# Patient Record
Sex: Male | Born: 1970 | Race: White | Hispanic: No | Marital: Single | Smoking: Current every day smoker
Health system: Southern US, Community
[De-identification: ages and names within clinical notes are randomized; demographics above are authoritative.]

## PROBLEM LIST (undated history)

## (undated) DIAGNOSIS — F1910 Other psychoactive substance abuse, uncomplicated: Secondary | ICD-10-CM

## (undated) DIAGNOSIS — F32A Depression, unspecified: Secondary | ICD-10-CM

## (undated) DIAGNOSIS — F419 Anxiety disorder, unspecified: Secondary | ICD-10-CM

## (undated) DIAGNOSIS — S069XAA Unspecified intracranial injury with loss of consciousness status unknown, initial encounter: Secondary | ICD-10-CM

## (undated) DIAGNOSIS — F329 Major depressive disorder, single episode, unspecified: Secondary | ICD-10-CM

## (undated) DIAGNOSIS — K759 Inflammatory liver disease, unspecified: Secondary | ICD-10-CM

## (undated) DIAGNOSIS — S069X9A Unspecified intracranial injury with loss of consciousness of unspecified duration, initial encounter: Secondary | ICD-10-CM

## (undated) DIAGNOSIS — L409 Psoriasis, unspecified: Secondary | ICD-10-CM

## (undated) HISTORY — PX: GASTROSTOMY TUBE PLACEMENT: SHX655

## (undated) HISTORY — PX: APPENDECTOMY: SHX54

---

## 2011-01-02 ENCOUNTER — Emergency Department (HOSPITAL_COMMUNITY)
Admission: EM | Admit: 2011-01-02 | Discharge: 2011-01-03 | Disposition: A | Discharge status: Other Acute Inpatient Hospital | Payer: Medicare Other | Source: Home / Self Care | Attending: Emergency Medicine | Admitting: Emergency Medicine

## 2011-01-02 DIAGNOSIS — F3289 Other specified depressive episodes: Secondary | ICD-10-CM | POA: Insufficient documentation

## 2011-01-02 DIAGNOSIS — F329 Major depressive disorder, single episode, unspecified: Secondary | ICD-10-CM | POA: Insufficient documentation

## 2011-01-02 DIAGNOSIS — I10 Essential (primary) hypertension: Secondary | ICD-10-CM | POA: Insufficient documentation

## 2011-01-02 DIAGNOSIS — F1910 Other psychoactive substance abuse, uncomplicated: Secondary | ICD-10-CM | POA: Insufficient documentation

## 2011-01-03 ENCOUNTER — Inpatient Hospital Stay (HOSPITAL_COMMUNITY)
Admission: RE | Admit: 2011-01-03 | Discharge: 2011-01-06 | DRG: 897 | Disposition: A | Discharge status: Home / Self Care | Payer: Medicare Other | Source: Ambulatory Visit | Attending: Psychiatry | Admitting: Psychiatry

## 2011-01-03 DIAGNOSIS — Z560 Unemployment, unspecified: Secondary | ICD-10-CM

## 2011-01-03 DIAGNOSIS — F1020 Alcohol dependence, uncomplicated: Secondary | ICD-10-CM

## 2011-01-03 DIAGNOSIS — F19939 Other psychoactive substance use, unspecified with withdrawal, unspecified: Secondary | ICD-10-CM

## 2011-01-03 DIAGNOSIS — Z111 Encounter for screening for respiratory tuberculosis: Secondary | ICD-10-CM

## 2011-01-03 DIAGNOSIS — B1920 Unspecified viral hepatitis C without hepatic coma: Secondary | ICD-10-CM

## 2011-01-03 DIAGNOSIS — F10239 Alcohol dependence with withdrawal, unspecified: Secondary | ICD-10-CM

## 2011-01-03 DIAGNOSIS — F10939 Alcohol use, unspecified with withdrawal, unspecified: Secondary | ICD-10-CM

## 2011-01-03 DIAGNOSIS — I10 Essential (primary) hypertension: Secondary | ICD-10-CM

## 2011-01-03 DIAGNOSIS — Z5900 Homelessness unspecified: Secondary | ICD-10-CM

## 2011-01-03 DIAGNOSIS — Z6379 Other stressful life events affecting family and household: Secondary | ICD-10-CM

## 2011-01-03 DIAGNOSIS — F1920 Other psychoactive substance dependence, uncomplicated: Principal | ICD-10-CM

## 2011-01-03 LAB — COMPREHENSIVE METABOLIC PANEL
Alkaline Phosphatase: 69 U/L (ref 39–117)
BUN: 12 mg/dL (ref 6–23)
CO2: 22 mEq/L (ref 19–32)
Calcium: 9.1 mg/dL (ref 8.4–10.5)
GFR calc non Af Amer: >60 mL/min (ref >60)
Glucose, Bld: 122 mg/dL — ABNORMAL HIGH (ref 70–99)
Potassium: 3.6 mEq/L (ref 3.5–5.1)
Total Protein: 6.3 g/dL (ref 6.0–8.3)

## 2011-01-03 LAB — CBC
HCT: 41.3 % (ref 39.0–52.0)
Hemoglobin: 14.6 g/dL (ref 13.0–17.0)
MCHC: 35.4 g/dL (ref 30.0–36.0)
MCV: 95.6 fL (ref 78.0–100.0)
RDW: 13.8 % (ref 11.5–15.5)

## 2011-01-03 LAB — DIFFERENTIAL
Eosinophils Relative: 4 % (ref 0–5)
Lymphocytes Relative: 37 % (ref 12–46)
Lymphs Abs: 2.9 10*3/uL (ref 0.7–4.0)
Monocytes Absolute: 0.6 10*3/uL (ref 0.1–1.0)

## 2011-01-03 LAB — ETHANOL: Alcohol, Ethyl (B): <11 mg/dL — ABNORMAL HIGH (ref 0–10)

## 2011-01-03 LAB — RAPID URINE DRUG SCREEN, HOSP PERFORMED
Barbiturates: NOT DETECTED
Opiates: NOT DETECTED

## 2011-01-04 DIAGNOSIS — F1920 Other psychoactive substance dependence, uncomplicated: Secondary | ICD-10-CM
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
## 2011-01-07 ENCOUNTER — Emergency Department (HOSPITAL_COMMUNITY)
Admission: EM | Admit: 2011-01-07 | Discharge: 2011-01-07 | Disposition: A | Discharge status: Home / Self Care | Payer: Medicare Other | Attending: Emergency Medicine | Admitting: Emergency Medicine

## 2011-01-07 ENCOUNTER — Emergency Department (HOSPITAL_COMMUNITY): Payer: Medicare Other

## 2011-01-07 DIAGNOSIS — R10813 Right lower quadrant abdominal tenderness: Secondary | ICD-10-CM | POA: Insufficient documentation

## 2011-01-07 DIAGNOSIS — F3289 Other specified depressive episodes: Secondary | ICD-10-CM | POA: Insufficient documentation

## 2011-01-07 DIAGNOSIS — F329 Major depressive disorder, single episode, unspecified: Secondary | ICD-10-CM | POA: Insufficient documentation

## 2011-01-07 DIAGNOSIS — R1031 Right lower quadrant pain: Secondary | ICD-10-CM | POA: Insufficient documentation

## 2011-01-07 DIAGNOSIS — I10 Essential (primary) hypertension: Secondary | ICD-10-CM | POA: Insufficient documentation

## 2011-01-07 LAB — CBC
MCV: 97.1 fL (ref 78.0–100.0)
Platelets: 292 10*3/uL (ref 150–400)
RBC: 4.85 MIL/uL (ref 4.22–5.81)
RDW: 13.7 % (ref 11.5–15.5)
WBC: 10.8 10*3/uL — ABNORMAL HIGH (ref 4.0–10.5)

## 2011-01-07 LAB — DIFFERENTIAL
Basophils Absolute: 0.1 10*3/uL (ref 0.0–0.1)
Basophils Relative: 1 % (ref 0–1)
Eosinophils Absolute: 0.4 10*3/uL (ref 0.0–0.7)
Eosinophils Relative: 3 % (ref 0–5)
Lymphs Abs: 3.4 10*3/uL (ref 0.7–4.0)
Neutrophils Relative %: 57 % (ref 43–77)

## 2011-01-07 LAB — URINALYSIS, ROUTINE W REFLEX MICROSCOPIC
Glucose, UA: NEGATIVE mg/dL
Ketones, ur: NEGATIVE mg/dL
Nitrite: NEGATIVE
Protein, ur: NEGATIVE mg/dL
Urobilinogen, UA: 0.2 mg/dL (ref 0.0–1.0)

## 2011-01-07 LAB — POCT I-STAT, CHEM 8
BUN: 14 mg/dL (ref 6–23)
Calcium, Ion: 1.15 mmol/L (ref 1.12–1.32)
HCT: 50 % (ref 39.0–52.0)
Sodium: 140 mEq/L (ref 135–145)
TCO2: 27 mmol/L (ref 0–100)

## 2011-01-07 LAB — ETHANOL: Alcohol, Ethyl (B): 45 mg/dL — ABNORMAL HIGH (ref 0–10)

## 2011-01-07 MED ORDER — IOHEXOL 300 MG/ML  SOLN: 100.0000 mL | Freq: Once | INTRAMUSCULAR | Status: DC | PRN

## 2011-01-08 ENCOUNTER — Emergency Department (HOSPITAL_COMMUNITY)
Admission: EM | Admit: 2011-01-08 | Discharge: 2011-01-10 | Disposition: A | Discharge status: Home / Self Care | Payer: Medicare Other | Attending: Emergency Medicine | Admitting: Emergency Medicine

## 2011-01-08 DIAGNOSIS — I10 Essential (primary) hypertension: Secondary | ICD-10-CM | POA: Insufficient documentation

## 2011-01-08 DIAGNOSIS — F329 Major depressive disorder, single episode, unspecified: Secondary | ICD-10-CM | POA: Insufficient documentation

## 2011-01-08 DIAGNOSIS — F1010 Alcohol abuse, uncomplicated: Secondary | ICD-10-CM | POA: Insufficient documentation

## 2011-01-08 DIAGNOSIS — F1110 Opioid abuse, uncomplicated: Secondary | ICD-10-CM | POA: Insufficient documentation

## 2011-01-08 DIAGNOSIS — F3289 Other specified depressive episodes: Secondary | ICD-10-CM | POA: Insufficient documentation

## 2011-01-08 LAB — URINALYSIS, ROUTINE W REFLEX MICROSCOPIC
Hgb urine dipstick: NEGATIVE
Nitrite: NEGATIVE
Protein, ur: NEGATIVE mg/dL
Specific Gravity, Urine: 1.024 (ref 1.005–1.030)
Urobilinogen, UA: 1 mg/dL (ref 0.0–1.0)

## 2011-01-08 LAB — RAPID URINE DRUG SCREEN, HOSP PERFORMED
Amphetamines: NOT DETECTED
Barbiturates: NOT DETECTED
Opiates: NOT DETECTED
Tetrahydrocannabinol: POSITIVE — AB

## 2011-01-09 LAB — COMPREHENSIVE METABOLIC PANEL
ALT: 112 U/L — ABNORMAL HIGH (ref 0–53)
Alkaline Phosphatase: 66 U/L (ref 39–117)
BUN: 12 mg/dL (ref 6–23)
CO2: 30 mEq/L (ref 19–32)
GFR calc non Af Amer: >60 mL/min (ref >60)
Glucose, Bld: 110 mg/dL — ABNORMAL HIGH (ref 70–99)
Potassium: 4 mEq/L (ref 3.5–5.1)
Sodium: 142 mEq/L (ref 135–145)
Total Bilirubin: 0.3 mg/dL (ref 0.3–1.2)

## 2011-01-09 LAB — DIFFERENTIAL
Basophils Absolute: 0 10*3/uL (ref 0.0–0.1)
Lymphocytes Relative: 37 % (ref 12–46)
Lymphs Abs: 2.9 10*3/uL (ref 0.7–4.0)
Monocytes Absolute: 0.8 10*3/uL (ref 0.1–1.0)
Neutro Abs: 3.7 10*3/uL (ref 1.7–7.7)

## 2011-01-09 LAB — CBC
HCT: 44.3 % (ref 39.0–52.0)
Hemoglobin: 15.4 g/dL (ref 13.0–17.0)
MCV: 97.1 fL (ref 78.0–100.0)
WBC: 7.7 10*3/uL (ref 4.0–10.5)

## 2011-01-09 LAB — ETHANOL: Alcohol, Ethyl (B): <11 mg/dL — ABNORMAL HIGH (ref 0–10)

## 2011-01-13 NOTE — Discharge Summary (Signed)
  NAMEMAYJOR, AGER                   ACCOUNT NO.:  [id number]  MEDICAL RECORD NO.:  [id number]           PATIENT TYPE:  I  LOCATION:  0300                          FACILITY:  [facility]  PHYSICIAN:  Anselm Jungling, MD  DATE OF BIRTH:  July 24, 1971  DATE OF ADMISSION:  01/03/2011 DATE OF DISCHARGE:  01/06/2011                              DISCHARGE SUMMARY   IDENTIFYING DATA/REASON FOR ADMISSION:  This was an inpatient psychiatric admission for Nael, a 40 year old male, who was admitted for treatment of chronic polysubstance dependence.  Please refer to the admission note for further details pertaining to the symptoms, circumstances and history that led to his hospitalization.  He was given an initial Axis I diagnosis of polysubstance dependence.  MEDICAL/LABORATORY:  The patient was medically and physically assessed by the psychiatric nurse practitioner.  He was in generally good health. He had mild elevations of liver function indices consistent with alcohol dependence.  Urine drug screen was positive for benzodiazepines and marijuana.  He participated in therapeutic groups and activities, and was a good participant throughout. He was detoxified using a standard Librium taper.  He worked closely with case management towards an aftercare plan, but ultimately it was determined that he would not enter any particular follow up program outside of [facility].  This was in keeping with his wishes.  He agreed to the following aftercare plan.  AFTERCARE:  The patient was to attend as many Narcotics Anonymous meetings in the community as he could.  DISCHARGE MEDICATIONS:  None.  DISCHARGE DIAGNOSES:  AXIS I:  Polysubstance dependence. AXIS II:  Deferred. AXIS III:  No acute or chronic illnesses. AXIS IV:  Stressors severe. AXIS V:  Global assessment of functioning on discharge 44.     Anselm Jungling, MD     SPB/MEDQ  D:  01/08/2011  T:  01/09/2011  Job:   [id number]  Electronically Signed by Geralyn Flash MD on 01/13/2011 02:00:57 PM

## 2011-01-20 ENCOUNTER — Emergency Department (HOSPITAL_COMMUNITY)
Admission: EM | Admit: 2011-01-20 | Discharge: 2011-01-21 | Disposition: A | Discharge status: Other Acute Inpatient Hospital | Payer: Medicare Other | Attending: Emergency Medicine | Admitting: Emergency Medicine

## 2011-01-20 DIAGNOSIS — F1110 Opioid abuse, uncomplicated: Secondary | ICD-10-CM | POA: Insufficient documentation

## 2011-01-21 DIAGNOSIS — F4310 Post-traumatic stress disorder, unspecified: Secondary | ICD-10-CM

## 2011-01-21 LAB — RAPID URINE DRUG SCREEN, HOSP PERFORMED
Amphetamines: NOT DETECTED
Barbiturates: NOT DETECTED
Benzodiazepines: POSITIVE — AB
Opiates: NOT DETECTED

## 2011-01-21 LAB — COMPREHENSIVE METABOLIC PANEL
Albumin: 3.6 g/dL (ref 3.5–5.2)
Alkaline Phosphatase: 68 U/L (ref 39–117)
BUN: 15 mg/dL (ref 6–23)
GFR calc Af Amer: >60 mL/min (ref >60)
Potassium: 3.8 mEq/L (ref 3.5–5.1)
Sodium: 138 mEq/L (ref 135–145)
Total Protein: 6.9 g/dL (ref 6.0–8.3)

## 2011-01-21 LAB — DIFFERENTIAL
Basophils Absolute: 0.1 10*3/uL (ref 0.0–0.1)
Basophils Relative: 1 % (ref 0–1)
Eosinophils Absolute: 0.3 10*3/uL (ref 0.0–0.7)
Eosinophils Relative: 5 % (ref 0–5)

## 2011-01-21 LAB — CBC
MCV: 98.2 fL (ref 78.0–100.0)
Platelets: 255 10*3/uL (ref 150–400)
RDW: 13.4 % (ref 11.5–15.5)
WBC: 6.9 10*3/uL (ref 4.0–10.5)

## 2011-01-21 NOTE — Consult Note (Signed)
  NAMEBURNEY, [name]                   ACCOUNT NO.:  [id number]  MEDICAL RECORD NO.:  [id number]           PATIENT TYPE:  E  LOCATION:  WLED                         FACILITY:  [facility]  PHYSICIAN:  Eulogio Ditch, MD DATE OF BIRTH:  08/16/1971  DATE OF CONSULTATION:  01/21/2011 DATE OF DISCHARGE:                                CONSULTATION   REASON FOR CONSULTATION:  Opioids abuse.  HISTORY OF PRESENT ILLNESS:  This is a 40 year old male reported using Dilaudid-K4, morphine, heroin and Xanax.  The patient reported that he had a surgery for appendix and was given pain medication.  After that, he started abusing these drugs.  The patient was recently discharged after detox on Jan 06, 2011, from behavioral health.  The patient reported that he wants to go to the inpatient rehab after the detox. The patient denies any suicidal or homicidal ideations, is very logical and goal directed, not internally preoccupied.  Denies hearing any voices.  PAST MEDICAL HISTORY:  History of hypertension, not on any medications.  SOCIAL HISTORY:  The patient is a homeless.  MENTAL STATUS EXAMINATION:  Calm, cooperative during the interview. Fair eye contact.  Hygiene and grooming fair.  No abnormal movements noticed.  Speech normal in rate, rhythm and volume.  Mood, depressed. Affect mood, congruent.  Thought process, logical and goal directed. Not suicidal or homicidal.  Not delusional.  No audiovisual hallucination reported.  Not internally preoccupied.  Cognition, alert, awake, oriented x3.  Memory, immediate, recent and remote fair. Attention concentration, fair.  Abstraction ability, fair.  Insight and judgment, poor because of the drug abuse.  DIAGNOSES:  Axis I:  Polysubstance dependence. Axis II:  Deferred. Axis III:  Hypertension. Axis IV:  Substance abuse. Axis V:  50.  RECOMMENDATIONS: 1. The patient will be started on clonidine detox protocol. 2. The patient will be referred to the  inpatient rehab.    Eulogio Ditch, MD    SA/MEDQ  D:  01/21/2011  T:  01/21/2011  Job:  [id number]  Electronically Signed by Eulogio Ditch  on 01/21/2011 06:46:21 PM

## 2012-03-23 ENCOUNTER — Emergency Department (HOSPITAL_COMMUNITY)
Admission: EM | Admit: 2012-03-23 | Discharge: 2012-03-23 | Disposition: A | Discharge status: Home / Self Care | Payer: Medicare Other

## 2012-03-23 NOTE — ED Notes (Signed)
Pt checked in with nurse first, then immediately stated he needed to go get his wallet and went outside.  Tech at doorway to await pt arrival back to ED.
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
## 2015-06-02 ENCOUNTER — Encounter (HOSPITAL_COMMUNITY): Payer: Self-pay | Admitting: Emergency Medicine

## 2015-06-02 ENCOUNTER — Emergency Department (HOSPITAL_COMMUNITY)
Admission: EM | Admit: 2015-06-02 | Discharge: 2015-06-02 | Disposition: A | Discharge status: Home / Self Care | Payer: Medicare HMO | Attending: Emergency Medicine | Admitting: Emergency Medicine

## 2015-06-02 ENCOUNTER — Emergency Department (HOSPITAL_COMMUNITY): Payer: Medicare HMO

## 2015-06-02 DIAGNOSIS — Y9389 Activity, other specified: Secondary | ICD-10-CM | POA: Diagnosis not present

## 2015-06-02 DIAGNOSIS — T43014A Poisoning by tricyclic antidepressants, undetermined, initial encounter: Secondary | ICD-10-CM | POA: Insufficient documentation

## 2015-06-02 DIAGNOSIS — Y9289 Other specified places as the place of occurrence of the external cause: Secondary | ICD-10-CM | POA: Insufficient documentation

## 2015-06-02 DIAGNOSIS — T50904A Poisoning by unspecified drugs, medicaments and biological substances, undetermined, initial encounter: Secondary | ICD-10-CM

## 2015-06-02 DIAGNOSIS — F1410 Cocaine abuse, uncomplicated: Secondary | ICD-10-CM | POA: Insufficient documentation

## 2015-06-02 DIAGNOSIS — Y998 Other external cause status: Secondary | ICD-10-CM | POA: Diagnosis not present

## 2015-06-02 DIAGNOSIS — Z87820 Personal history of traumatic brain injury: Secondary | ICD-10-CM | POA: Insufficient documentation

## 2015-06-02 HISTORY — DX: Unspecified intracranial injury with loss of consciousness of unspecified duration, initial encounter: S06.9X9A

## 2015-06-02 HISTORY — DX: Major depressive disorder, single episode, unspecified: F32.9

## 2015-06-02 HISTORY — DX: Unspecified intracranial injury with loss of consciousness status unknown, initial encounter: S06.9XAA

## 2015-06-02 HISTORY — DX: Anxiety disorder, unspecified: F41.9

## 2015-06-02 HISTORY — DX: Depression, unspecified: F32.A

## 2015-06-02 LAB — CBC WITH DIFFERENTIAL/PLATELET
BASOS ABS: 0 10*3/uL (ref 0.0–0.1)
BASOS PCT: 0 %
EOS ABS: 0.2 10*3/uL (ref 0.0–0.7)
Eosinophils Relative: 2 %
HEMATOCRIT: 42.6 % (ref 39.0–52.0)
Hemoglobin: 15 g/dL (ref 13.0–17.0)
Lymphocytes Relative: 29 %
Lymphs Abs: 2.5 10*3/uL (ref 0.7–4.0)
MCH: 32.8 pg (ref 26.0–34.0)
MCHC: 35.2 g/dL (ref 30.0–36.0)
MCV: 93 fL (ref 78.0–100.0)
MONO ABS: 0.5 10*3/uL (ref 0.1–1.0)
Monocytes Relative: 6 %
NEUTROS ABS: 5.5 10*3/uL (ref 1.7–7.7)
Neutrophils Relative %: 63 %
PLATELETS: 204 10*3/uL (ref 150–400)
RBC: 4.58 MIL/uL (ref 4.22–5.81)
RDW: 13.2 % (ref 11.5–15.5)
WBC: 8.7 10*3/uL (ref 4.0–10.5)

## 2015-06-02 LAB — URINALYSIS, ROUTINE W REFLEX MICROSCOPIC
Bilirubin Urine: NEGATIVE
GLUCOSE, UA: NEGATIVE mg/dL
Hgb urine dipstick: NEGATIVE
Ketones, ur: NEGATIVE mg/dL
LEUKOCYTES UA: NEGATIVE
NITRITE: NEGATIVE
PH: 6 (ref 5.0–8.0)
Protein, ur: NEGATIVE mg/dL
Specific Gravity, Urine: 1.003 — ABNORMAL LOW (ref 1.005–1.030)
Urobilinogen, UA: 0.2 mg/dL (ref 0.0–1.0)

## 2015-06-02 LAB — COMPREHENSIVE METABOLIC PANEL
ALBUMIN: 4.3 g/dL (ref 3.5–5.0)
ALT: 51 U/L (ref 17–63)
AST: 57 U/L — AB (ref 15–41)
Alkaline Phosphatase: 111 U/L (ref 38–126)
Anion gap: 6 (ref 5–15)
BUN: 7 mg/dL (ref 6–20)
CHLORIDE: 111 mmol/L (ref 101–111)
CO2: 18 mmol/L — AB (ref 22–32)
Calcium: 8.9 mg/dL (ref 8.9–10.3)
Creatinine, Ser: 0.98 mg/dL (ref 0.61–1.24)
GFR calc Af Amer: >60 mL/min (ref >60)
GFR calc non Af Amer: >60 mL/min (ref >60)
GLUCOSE: 105 mg/dL — AB (ref 65–99)
Potassium: 4.5 mmol/L (ref 3.5–5.1)
SODIUM: 135 mmol/L (ref 135–145)
Total Bilirubin: 0.9 mg/dL (ref 0.3–1.2)
Total Protein: 8.1 g/dL (ref 6.5–8.1)

## 2015-06-02 LAB — RAPID URINE DRUG SCREEN, HOSP PERFORMED
AMPHETAMINES: NOT DETECTED
BARBITURATES: NOT DETECTED
BENZODIAZEPINES: NOT DETECTED
COCAINE: POSITIVE — AB
Opiates: NOT DETECTED
Tetrahydrocannabinol: NOT DETECTED

## 2015-06-02 LAB — ACETAMINOPHEN LEVEL

## 2015-06-02 MED ORDER — HYDROXYZINE HCL 25 MG PO TABS
25.0000 mg | ORAL_TABLET | Freq: Once | ORAL | Status: AC
  Administered 2015-06-02: 25 mg via ORAL
  Filled 2015-06-02: qty 1

## 2015-06-02 MED ORDER — NALOXONE HCL 0.4 MG/ML IJ SOLN
0.4000 mg | Freq: Once | INTRAMUSCULAR | Status: AC
  Administered 2015-06-02: 0.4 mg via INTRAVENOUS
  Filled 2015-06-02: qty 1

## 2015-06-02 MED ORDER — SODIUM CHLORIDE 0.9 % IV BOLUS (SEPSIS)
1000.0000 mL | Freq: Once | INTRAVENOUS | Status: AC
  Administered 2015-06-02: 1000 mL via INTRAVENOUS

## 2015-06-02 NOTE — ED Notes (Signed)
Resting quietly with eye closed. Easily arousable. Verbally responsive. Resp even and unlabored. ABC's intact. IV saline lock patent and intact. NAD noted. Family at bedside

## 2015-06-02 NOTE — ED Notes (Signed)
Awake. Verbally responsive. A/O x4. Resp even and unlabored. No audible adventitious breath sounds noted. ABC's intact. SR on monitor. IV SL patent and intact. Staff monitoring closely.

## 2015-06-02 NOTE — ED Notes (Signed)
Pt awake and verbally responsive. Resp even and unlabored. No audible adventitious breath sounds noted. ABC's intact. SR on monitor. Pt removed gown, BP/EKG leads and PO. Pt crawled OOB and stood and up and void on floor.  Pt reported anxiety and requesting Vistaril. [place], [place] aware with new order noted.

## 2015-06-02 NOTE — ED Notes (Signed)
Per MD- EKG to be completed at 640

## 2015-06-02 NOTE — ED Notes (Signed)
Pt reported that he was smoking marijuana laced with cocaine last night.

## 2015-06-02 NOTE — ED Notes (Signed)
Bed: ZO10 Expected date:  Expected time:  Means of arrival:  Comments: EMS- OD/SI

## 2015-06-02 NOTE — ED Provider Notes (Signed)
CSN: [id number]     Arrival date & time 06/02/15  1307 History   First MD Initiated Contact with Patient 06/02/15 1339     Chief Complaint  Patient presents with  . Drug Overdose     (Consider location/radiation/quality/duration/timing/severity/associated sxs/prior Treatment) HPI [name] is a 44 y.o. male who comes in for evaluation of drug overdose. Patient is accompanied by his friend who contributes to history of present illness. Friend and patient state that at approximately 9:00 AM, patient took 4 Elavil as given to him by his roommate. Friend reports that he and patient did cocaine this morning at approximately 8:00. Denies any Xanax or other benzodiazepine use. No alcohol use. Patient denies any suicidal or homicidal ideations at this time. No auditory or visual hallucinations.  Past Medical History  Diagnosis Date  . TBI (traumatic brain injury) [facility])    History reviewed. No pertinent past surgical history. No family history on file. Social History  Substance Use Topics  . Smoking status: None  . Smokeless tobacco: None  . Alcohol Use: None    Review of Systems A 10 point review of systems was completed and was negative except for pertinent positives and negatives as mentioned in the history of present illness     Allergies  Review of patient's allergies indicates not on file.  Home Medications   Prior to Admission medications   Not on File   BP 153/98 mmHg  Pulse 97  Temp(Src) 98.7 F (37.1 C) (Oral)  Resp 25  SpO2 95% Physical Exam  Constitutional: He is oriented to person, place, and time. He appears well-developed and well-nourished.  HENT:  Head: Normocephalic and atraumatic.  Mouth/Throat: Oropharynx is clear and moist.  Eyes: Conjunctivae and EOM are normal. Pupils are equal, round, and reactive to light. Right eye exhibits no discharge. Left eye exhibits no discharge. No scleral icterus.  Right periorbital ecchymosis from previous injury.  Extraocular movements intact without discomfort.  Neck: Neck supple.  Cardiovascular: Normal rate, regular rhythm and normal heart sounds.   Pulmonary/Chest: Effort normal and breath sounds normal. No respiratory distress. He has no wheezes. He has no rales.  Abdominal: Soft. There is no tenderness.  Musculoskeletal: He exhibits no tenderness.  Neurological: He is alert and oriented to person, place, and time.  Cranial Nerves II-XII grossly intact  Skin: Skin is warm and dry. No rash noted.  Psychiatric: He has a normal mood and affect.  Mildly slurred speech with some mild somnolence. Answers questions appropriately. Mood and affect are otherwise appropriate.  Nursing note and vitals reviewed.   ED Course  Procedures (including critical care time) Labs Review Labs Reviewed - No data to display  Imaging Review No results found. I have personally reviewed and evaluated these images and lab results as part of my medical decision-making.   EKG Interpretation   Date/Time:  Monday June 02 2015 13:16:05 EDT Ventricular Rate:  102 PR Interval:  164 QRS Duration: 108 QT Interval:  353 QTC Calculation: 460 R Axis:   56 Text Interpretation:  Sinus tachycardia Probable left atrial enlargement  No previous ECGs available Confirmed by NGUYEN, EMILY ([id number]) on 06/02/2015  2:51:46 PM     Meds given in ED:  Medications  sodium chloride 0.9 % bolus 1,000 mL (not administered)  naloxone (NARCAN) injection 0.4 mg (not administered)    New Prescriptions   No medications on file   Filed Vitals:   06/02/15 1330 06/02/15 1400 06/02/15 1408 06/02/15 1439  BP:  155/105  153/98 135/82  Pulse:  98 97 90  Temp:      TempSrc:      Resp: SpO2:  96% 95% 95%   2:45pm--discussed with poison control, Beth recommends 6 hours of observation, some concern with cardiotoxicity with Elavil/amitriptyline. Recommends obtaining serial EKGs. CBC, CMET. MDM  Patient here with a history of  polysubstance abuse comes in for evaluation of possible Elavil overdose. Medication reportedly belongs to patient's roommate and patient willingly took medication. Denies any suicidal or homicidal ideations. No auditory or visual hallucinations. Discussed with poison control and due to cardiotoxicity, recommends serial EKGs and observation for 6 hours. Also recommends obtaining basic labs. Patient also has significant right eye ecchymosis. Will obtain CT maxillofacial to further evaluate for possible fracture. Patient care signed out to Dr. Hyacinth Meeker, who will follow-up on pending labs as well as serial EKGs, subsequent reevaluation and disposition. Final diagnoses:  None        Joycie Peek, PA-C 06/02/15 1758  Leta Baptist, MD 06/02/15 2145

## 2015-06-02 NOTE — ED Notes (Signed)
Resting quietly with eye closed. Easily arousable. Verbally responsive. Resp even and unlabored. ABC's intact. IV infusing NS at 999ml/hr without difficulty. NAD noted.  

## 2015-06-02 NOTE — Discharge Instructions (Signed)

## 2015-06-02 NOTE — ED Notes (Addendum)
Pt arrived via EMS with report of confusion, slurred speech, staggering gait s/p to taking 5 Elavil or Xanax. Pt report taking 2 Elavils Wednesday and last night but denies taking Xanax or SI at this time. Family at bedside and reported pt taking Elavil x5 in past 24hrs and using cocaine last night around 12mn.

## 2015-06-02 NOTE — ED Notes (Signed)
Pt constantly out of bed, ripping monitor leads off, at one point urinated in the floor. Easy to redirect, however redirection constantly needed. Door open, bed rails up, call bell within reach.

## 2016-03-04 ENCOUNTER — Telehealth: Payer: Self-pay | Admitting: Lab

## 2016-03-04 NOTE — Telephone Encounter (Signed)
Triage received lab test on this patient from [facility] without any explanation or referral attached. It then was given to me.   I contacted the office and [facility]  He asked me to fax a copy of the new referral form to the attention of Ladene ArtistDerrick, the referral coordinator there.  Once the required information is received, the Hep C process will began ASAP.  Patient had contacted me directly on 02/03/2016.  I explained the process to him.

## 2016-03-16 ENCOUNTER — Other Ambulatory Visit: Payer: Medicare Other

## 2016-03-16 DIAGNOSIS — K7400 Hepatic fibrosis, unspecified: Secondary | ICD-10-CM

## 2016-03-16 DIAGNOSIS — B182 Chronic viral hepatitis C: Secondary | ICD-10-CM

## 2016-03-17 LAB — HEPATITIS B SURFACE ANTIBODY,QUALITATIVE: Hep B S Ab: NEGATIVE

## 2016-03-17 LAB — PROTIME-INR
INR: 1.1
PROTHROMBIN TIME: 11.3 s (ref 9.0–11.5)

## 2016-03-17 LAB — COMPREHENSIVE METABOLIC PANEL
ALBUMIN: 4.2 g/dL (ref 3.6–5.1)
ALT: 48 U/L — ABNORMAL HIGH (ref 9–46)
AST: 66 U/L — ABNORMAL HIGH (ref 10–40)
Alkaline Phosphatase: 165 U/L — ABNORMAL HIGH (ref 40–115)
BUN: 13 mg/dL (ref 7–25)
CALCIUM: 9.3 mg/dL (ref 8.6–10.3)
CHLORIDE: 102 mmol/L (ref 98–110)
CO2: 21 mmol/L (ref 20–31)
Creat: 0.94 mg/dL (ref 0.60–1.35)
Glucose, Bld: 154 mg/dL — ABNORMAL HIGH (ref 65–99)
POTASSIUM: 3.8 mmol/L (ref 3.5–5.3)
Sodium: 137 mmol/L (ref 135–146)
TOTAL PROTEIN: 7.4 g/dL (ref 6.1–8.1)
Total Bilirubin: 0.7 mg/dL (ref 0.2–1.2)

## 2016-03-17 LAB — HEPATITIS B CORE ANTIBODY, TOTAL: HEP B C TOTAL AB: NONREACTIVE

## 2016-03-17 LAB — HEPATITIS A ANTIBODY, TOTAL: HEP A TOTAL AB: NONREACTIVE

## 2016-03-17 LAB — HEPATITIS B SURFACE ANTIGEN: Hepatitis B Surface Ag: NEGATIVE

## 2016-03-17 LAB — AFP TUMOR MARKER: AFP TUMOR MARKER: 13.7 ng/mL — AB (ref <6.1)

## 2016-03-19 LAB — LIVER FIBROSIS, FIBROTEST-ACTITEST
ALT: 44 U/L (ref 9–46)
Alpha-2-Macroglobulin: 539 mg/dL — ABNORMAL HIGH (ref 106–279)
Apolipoprotein A1: 146 mg/dL (ref 94–176)
Bilirubin: 0.6 mg/dL (ref 0.2–1.2)
Fibrosis Score: 0.87
GGT: 199 U/L — AB (ref 3–95)
HAPTOGLOBIN: 71 mg/dL (ref 43–212)
NECROINFLAMMAT ACT SCORE: 0.44
Reference ID: 1581688

## 2016-03-20 LAB — HCV RNA,LIPA RFLX NS5A DRUG RESIST

## 2016-03-20 LAB — HCV RNA, QUANT REAL-TIME PCR W/REFLEX
HCV RNA, PCR, QN (LOG): 5.48 {Log_IU}/mL — AB
HCV RNA, PCR, QN: 303000 IU/mL — ABNORMAL HIGH

## 2016-03-31 ENCOUNTER — Encounter: Payer: Self-pay | Admitting: Internal Medicine

## 2016-03-31 ENCOUNTER — Ambulatory Visit (INDEPENDENT_AMBULATORY_CARE_PROVIDER_SITE_OTHER): Payer: Medicare HMO | Admitting: Internal Medicine

## 2016-03-31 DIAGNOSIS — K7460 Unspecified cirrhosis of liver: Secondary | ICD-10-CM | POA: Diagnosis not present

## 2016-03-31 DIAGNOSIS — B182 Chronic viral hepatitis C: Secondary | ICD-10-CM | POA: Diagnosis not present

## 2016-03-31 NOTE — Progress Notes (Signed)
Patient ID: [name], male   DOB: [birth year], 45 y.o.   MRN: [id number]     [facility]ious [facility]   CC: consideration for treatment for chronic hepatitis C  HPI:  +[name] is a 45 y.o. male who presents for initial evaluation and management of chronic hepatitis C.  Patient tested positive earlier this year. Hepatitis C-associated risk factors present are: IV drug abuse (details: currently using but is going to get into a treatment program). Patient denies multiple sexual partners, renal dialysis, sexual contact with person with liver disease. Patient has had other studies performed. Results: hepatitis C RNA by PCR, result: positive. Patient has not had prior treatment for Hepatitis C. Patient does not have a past history of liver disease. Patient does not have a family history of liver disease. Patient does not  have associated signs or symptoms related to liver disease.  Labs reviewed and confirm chronic hepatitis C with a positive viral load.   Records reviewed and current drug use, previous positive drug screen.       Patient does not have documented immunity to Hepatitis A. Patient does not have documented immunity to Hepatitis B.    Review of Systems:   Constitutional: negative for malaise and anorexia Gastrointestinal: negative for diarrhea All other systems reviewed and are negative      Past Medical History:  Diagnosis Date  . Anxiety   . Depressed   . TBI (traumatic brain injury) (HCC)     Prior to Admission medications   Medication Sig Start Date End Date Taking? Authorizing Provider  ARIPiprazole (ABILIFY) 5 MG tablet  03/19/16  Yes Historical Provider, MD  busPIRone (BUSPAR) 15 MG tablet  03/19/16  Yes Historical Provider, MD  calcipotriene (DOVONOX) 0.005 % cream  03/27/16  Yes Historical Provider, MD  ciclopirox (PENLAC) 8 % solution  03/25/16  Yes Historical Provider, MD  DULoxetine (CYMBALTA) 60 MG capsule Take 60 mg by mouth 2 (two) times daily.     Yes Historical Provider, MD  gabapentin (NEURONTIN) 600 MG tablet  03/25/16  Yes Historical Provider, MD  ketoconazole (NIZORAL) 2 % shampoo  03/27/16  Yes Historical Provider, MD  QUEtiapine Fumarate (SEROQUEL XR) 150 MG 24 hr tablet  02/02/16  Yes Historical Provider, MD  SUBOXONE 8-2 MG FILM  03/25/16  Yes Historical Provider, MD    No Known Allergies  Social History  Substance Use Topics  . Smoking status: Current Every Day Smoker    Packs/day: 0.50    Types: Cigarettes  . Smokeless tobacco: Never Used  . Alcohol use No     Comment: occasional     FMHx: no liver cirrhosis, no liver cancer   Objective:  Constitutional: in no apparent distress and alert,  Vitals:   03/31/16 1051  BP: (!) 137/96  Pulse: 80  Temp: 98.1 F (36.7 C)   Eyes: anicteric Cardiovascular: Cor RRR Respiratory: CTA B; normal respiratory effort Gastrointestinal: Bowel sounds are normal, liver is not enlarged, spleen is not enlarged Musculoskeletal: no pedal edema noted Skin: negatives: no rash; no porphyria cutanea tarda Lymphatic: no cervical lymphadenopathy   Laboratory Genotype: No results found for: HCVGENOTYPE HCV viral load: No results found for: HCVQUANT Lab Results  Component Value Date   WBC 8.7 06/02/2015   HGB 15.0 06/02/2015   HCT 42.6 06/02/2015   MCV 93.0 06/02/2015   PLT 204 06/02/2015    Lab Results  Component Value Date   CREATININE 0.94 03/16/2016   BUN 13  03/16/2016   NA 137 03/16/2016   K 3.8 03/16/2016   CL 102 03/16/2016   CO2 21 03/16/2016    Lab Results  Component Value Date   ALT 48 (H) 03/16/2016   AST 66 (H) 03/16/2016   ALKPHOS 165 (H) 03/16/2016     Labs and history reviewed and show CHILD-PUGH A  5-6 points: Child class A 7-9 points: Child class B 10-15 points: Child class C  Lab Results  Component Value Date   INR 1.1 03/16/2016   BILITOT 0.7 03/16/2016   ALBUMIN 4.2 03/16/2016     Assessment: New Patient with Chronic Hepatitis C genotype  3, untreated.  I discussed with the patient the lab findings that confirm chronic hepatitis C as well as the natural history and progression of disease including about 30% of people who develop cirrhosis of the liver if left untreated and once cirrhosis is established there is a 2-7% risk per year of liver cancer and liver failure.  I discussed the importance of treatment and benefits in reducing the risk, even if significant liver fibrosis exists.   Plan: 1) Patient counseled extensively on limiting acetaminophen to no more than 2 grams daily, avoidance of alcohol. 2) Transmission discussed with patient including sexual transmission, sharing razors and toothbrush.   3) Will need referral to gastroenterology if concern for cirrhosis 4) Will need referral for substance abuse counseling: Yes.  ; Further work up to include urine drug screen  No. 5) Will prescribe Epclusa for 12 weeks 6) Hepatitis A vaccine Yes.   7) Hepatitis B vaccine Yes.   8) Pneumovax vaccine if concern for cirrhosis 9) Further work up to include liver staging with ultrasound 10) will follow up after drug rehab

## 2016-04-16 ENCOUNTER — Other Ambulatory Visit: Payer: Self-pay

## 2016-05-06 ENCOUNTER — Inpatient Hospital Stay: Admission: RE | Admit: 2016-05-06 | Payer: Self-pay | Source: Ambulatory Visit

## 2016-05-07 ENCOUNTER — Telehealth: Payer: Self-pay | Admitting: *Deleted

## 2016-05-07 NOTE — Telephone Encounter (Signed)
Patient no showed her Scan and called to find out why. Left a message for patient to call the office.

## 2017-10-22 ENCOUNTER — Emergency Department (HOSPITAL_COMMUNITY): Payer: Medicare HMO

## 2017-10-22 ENCOUNTER — Inpatient Hospital Stay (HOSPITAL_COMMUNITY): Payer: Medicare HMO | Admitting: Certified Registered"

## 2017-10-22 ENCOUNTER — Encounter (HOSPITAL_COMMUNITY): Payer: Self-pay

## 2017-10-22 ENCOUNTER — Inpatient Hospital Stay (HOSPITAL_COMMUNITY)
Admission: EM | Admit: 2017-10-22 | Discharge: 2017-11-28 | DRG: 003 | Disposition: E | Payer: Medicare HMO | Attending: Surgery | Admitting: Surgery

## 2017-10-22 ENCOUNTER — Encounter (HOSPITAL_COMMUNITY): Admission: EM | Disposition: E | Payer: Self-pay | Source: Home / Self Care

## 2017-10-22 DIAGNOSIS — S066X9A Traumatic subarachnoid hemorrhage with loss of consciousness of unspecified duration, initial encounter: Secondary | ICD-10-CM | POA: Diagnosis present

## 2017-10-22 DIAGNOSIS — S82209B Unspecified fracture of shaft of unspecified tibia, initial encounter for open fracture type I or II: Secondary | ICD-10-CM

## 2017-10-22 DIAGNOSIS — D62 Acute posthemorrhagic anemia: Secondary | ICD-10-CM | POA: Diagnosis present

## 2017-10-22 DIAGNOSIS — S82232A Displaced oblique fracture of shaft of left tibia, initial encounter for closed fracture: Secondary | ICD-10-CM | POA: Diagnosis present

## 2017-10-22 DIAGNOSIS — N179 Acute kidney failure, unspecified: Secondary | ICD-10-CM | POA: Diagnosis not present

## 2017-10-22 DIAGNOSIS — Y92410 Unspecified street and highway as the place of occurrence of the external cause: Secondary | ICD-10-CM

## 2017-10-22 DIAGNOSIS — S299XXA Unspecified injury of thorax, initial encounter: Secondary | ICD-10-CM

## 2017-10-22 DIAGNOSIS — E876 Hypokalemia: Secondary | ICD-10-CM | POA: Diagnosis not present

## 2017-10-22 DIAGNOSIS — J9601 Acute respiratory failure with hypoxia: Secondary | ICD-10-CM

## 2017-10-22 DIAGNOSIS — B1920 Unspecified viral hepatitis C without hepatic coma: Secondary | ICD-10-CM | POA: Diagnosis present

## 2017-10-22 DIAGNOSIS — T884XXA Failed or difficult intubation, initial encounter: Secondary | ICD-10-CM

## 2017-10-22 DIAGNOSIS — R402312 Coma scale, best motor response, none, at arrival to emergency department: Secondary | ICD-10-CM | POA: Diagnosis present

## 2017-10-22 DIAGNOSIS — S022XXA Fracture of nasal bones, initial encounter for closed fracture: Secondary | ICD-10-CM | POA: Diagnosis present

## 2017-10-22 DIAGNOSIS — R414 Neurologic neglect syndrome: Secondary | ICD-10-CM | POA: Diagnosis present

## 2017-10-22 DIAGNOSIS — R739 Hyperglycemia, unspecified: Secondary | ICD-10-CM | POA: Diagnosis present

## 2017-10-22 DIAGNOSIS — T148XXA Other injury of unspecified body region, initial encounter: Secondary | ICD-10-CM

## 2017-10-22 DIAGNOSIS — Y9301 Activity, walking, marching and hiking: Secondary | ICD-10-CM | POA: Diagnosis present

## 2017-10-22 DIAGNOSIS — S0990XA Unspecified injury of head, initial encounter: Secondary | ICD-10-CM

## 2017-10-22 DIAGNOSIS — S01511A Laceration without foreign body of lip, initial encounter: Secondary | ICD-10-CM | POA: Diagnosis present

## 2017-10-22 DIAGNOSIS — S36031A Moderate laceration of spleen, initial encounter: Secondary | ICD-10-CM | POA: Diagnosis present

## 2017-10-22 DIAGNOSIS — R402212 Coma scale, best verbal response, none, at arrival to emergency department: Secondary | ICD-10-CM | POA: Diagnosis present

## 2017-10-22 DIAGNOSIS — S82832A Other fracture of upper and lower end of left fibula, initial encounter for closed fracture: Secondary | ICD-10-CM

## 2017-10-22 DIAGNOSIS — S82452A Displaced comminuted fracture of shaft of left fibula, initial encounter for closed fracture: Secondary | ICD-10-CM | POA: Diagnosis present

## 2017-10-22 DIAGNOSIS — F1910 Other psychoactive substance abuse, uncomplicated: Secondary | ICD-10-CM | POA: Diagnosis present

## 2017-10-22 DIAGNOSIS — S36039A Unspecified laceration of spleen, initial encounter: Secondary | ICD-10-CM

## 2017-10-22 DIAGNOSIS — S0240DA Maxillary fracture, left side, initial encounter for closed fracture: Secondary | ICD-10-CM | POA: Diagnosis present

## 2017-10-22 DIAGNOSIS — S062X9A Diffuse traumatic brain injury with loss of consciousness of unspecified duration, initial encounter: Secondary | ICD-10-CM

## 2017-10-22 DIAGNOSIS — E870 Hyperosmolality and hypernatremia: Secondary | ICD-10-CM | POA: Diagnosis present

## 2017-10-22 DIAGNOSIS — J9811 Atelectasis: Secondary | ICD-10-CM | POA: Diagnosis present

## 2017-10-22 DIAGNOSIS — S82409B Unspecified fracture of shaft of unspecified fibula, initial encounter for open fracture type I or II: Secondary | ICD-10-CM

## 2017-10-22 DIAGNOSIS — Z529 Donor of unspecified organ or tissue: Secondary | ICD-10-CM

## 2017-10-22 DIAGNOSIS — S82262A Displaced segmental fracture of shaft of left tibia, initial encounter for closed fracture: Secondary | ICD-10-CM

## 2017-10-22 DIAGNOSIS — R402112 Coma scale, eyes open, never, at arrival to emergency department: Secondary | ICD-10-CM | POA: Diagnosis present

## 2017-10-22 DIAGNOSIS — Z930 Tracheostomy status: Secondary | ICD-10-CM

## 2017-10-22 DIAGNOSIS — S062XAA Diffuse traumatic brain injury with loss of consciousness status unknown, initial encounter: Secondary | ICD-10-CM

## 2017-10-22 DIAGNOSIS — S032XXA Dislocation of tooth, initial encounter: Secondary | ICD-10-CM | POA: Diagnosis present

## 2017-10-22 DIAGNOSIS — Z87820 Personal history of traumatic brain injury: Secondary | ICD-10-CM

## 2017-10-22 DIAGNOSIS — S0181XA Laceration without foreign body of other part of head, initial encounter: Secondary | ICD-10-CM

## 2017-10-22 DIAGNOSIS — S06309A Unspecified focal traumatic brain injury with loss of consciousness of unspecified duration, initial encounter: Secondary | ICD-10-CM

## 2017-10-22 DIAGNOSIS — R0902 Hypoxemia: Secondary | ICD-10-CM

## 2017-10-22 DIAGNOSIS — J9690 Respiratory failure, unspecified, unspecified whether with hypoxia or hypercapnia: Secondary | ICD-10-CM

## 2017-10-22 DIAGNOSIS — D6489 Other specified anemias: Secondary | ICD-10-CM | POA: Diagnosis present

## 2017-10-22 DIAGNOSIS — I959 Hypotension, unspecified: Secondary | ICD-10-CM | POA: Diagnosis not present

## 2017-10-22 DIAGNOSIS — D696 Thrombocytopenia, unspecified: Secondary | ICD-10-CM | POA: Diagnosis not present

## 2017-10-22 DIAGNOSIS — Z419 Encounter for procedure for purposes other than remedying health state, unspecified: Secondary | ICD-10-CM

## 2017-10-22 HISTORY — DX: Other psychoactive substance abuse, uncomplicated: F19.10

## 2017-10-22 HISTORY — DX: Psoriasis, unspecified: L40.9

## 2017-10-22 HISTORY — DX: Inflammatory liver disease, unspecified: K75.9

## 2017-10-22 HISTORY — PX: LACERATION REPAIR: SHX5284

## 2017-10-22 LAB — COMPREHENSIVE METABOLIC PANEL
ALBUMIN: 3.3 g/dL — AB (ref 3.5–5.0)
ALT: 28 U/L (ref 17–63)
AST: 91 U/L — AB (ref 15–41)
Alkaline Phosphatase: 140 U/L — ABNORMAL HIGH (ref 38–126)
Anion gap: 18 — ABNORMAL HIGH (ref 5–15)
CHLORIDE: 106 mmol/L (ref 101–111)
CO2: 16 mmol/L — ABNORMAL LOW (ref 22–32)
CREATININE: 1.15 mg/dL (ref 0.61–1.24)
Calcium: 8.7 mg/dL — ABNORMAL LOW (ref 8.9–10.3)
GFR calc Af Amer: >60 mL/min (ref >60)
GFR calc non Af Amer: >60 mL/min (ref >60)
GLUCOSE: 135 mg/dL — AB (ref 65–99)
POTASSIUM: 3.9 mmol/L (ref 3.5–5.1)
Sodium: 140 mmol/L (ref 135–145)
Total Bilirubin: 0.9 mg/dL (ref 0.3–1.2)
Total Protein: 6.7 g/dL (ref 6.5–8.1)

## 2017-10-22 LAB — BPAM FFP
Blood Product Expiration Date: 201902282359
Blood Product Expiration Date: 201902282359
ISSUE DATE / TIME: 201902232133
ISSUE DATE / TIME: 201902232133
UNIT TYPE AND RH: 600
UNIT TYPE AND RH: 6200

## 2017-10-22 LAB — PREPARE FRESH FROZEN PLASMA
UNIT DIVISION: 0
Unit division: 0

## 2017-10-22 LAB — CBC
HEMATOCRIT: 42.3 % (ref 39.0–52.0)
Hemoglobin: 14.2 g/dL (ref 13.0–17.0)
MCH: 32.4 pg (ref 26.0–34.0)
MCHC: 33.6 g/dL (ref 30.0–36.0)
MCV: 96.6 fL (ref 78.0–100.0)
Platelets: 252 10*3/uL (ref 150–400)
RBC: 4.38 MIL/uL (ref 4.22–5.81)
RDW: 14.6 % (ref 11.5–15.5)
WBC: 13.7 10*3/uL — ABNORMAL HIGH (ref 4.0–10.5)

## 2017-10-22 LAB — I-STAT CG4 LACTIC ACID, ED: LACTIC ACID, VENOUS: 7.95 mmol/L — AB (ref 0.5–1.9)

## 2017-10-22 LAB — I-STAT CHEM 8, ED
Calcium, Ion: 1.07 mmol/L — ABNORMAL LOW (ref 1.15–1.40)
Chloride: 106 mmol/L (ref 101–111)
Creatinine, Ser: 0.9 mg/dL (ref 0.61–1.24)
Glucose, Bld: 134 mg/dL — ABNORMAL HIGH (ref 65–99)
HEMATOCRIT: 43 % (ref 39.0–52.0)
HEMOGLOBIN: 14.6 g/dL (ref 13.0–17.0)
POTASSIUM: 3.7 mmol/L (ref 3.5–5.1)
SODIUM: 143 mmol/L (ref 135–145)
TCO2: 20 mmol/L — AB (ref 22–32)

## 2017-10-22 LAB — URINALYSIS, ROUTINE W REFLEX MICROSCOPIC
BACTERIA UA: NONE SEEN
Bilirubin Urine: NEGATIVE
Glucose, UA: NEGATIVE mg/dL
Ketones, ur: NEGATIVE mg/dL
Leukocytes, UA: NEGATIVE
Nitrite: NEGATIVE
PROTEIN: NEGATIVE mg/dL
Specific Gravity, Urine: 1.02 (ref 1.005–1.030)
pH: 6 (ref 5.0–8.0)

## 2017-10-22 LAB — I-STAT ARTERIAL BLOOD GAS, ED
Acid-base deficit: 7 mmol/L — ABNORMAL HIGH (ref 0.0–2.0)
Bicarbonate: 22.5 mmol/L (ref 20.0–28.0)
O2 Saturation: 93 %
PCO2 ART: 61.5 mmHg — AB (ref 32.0–48.0)
PO2 ART: 88 mmHg (ref 83.0–108.0)
Patient temperature: 98.6
TCO2: 24 mmol/L (ref 22–32)
pH, Arterial: 7.172 — CL (ref 7.350–7.450)

## 2017-10-22 LAB — PROTIME-INR
INR: 1.16
PROTHROMBIN TIME: 14.7 s (ref 11.4–15.2)

## 2017-10-22 LAB — ETHANOL: Alcohol, Ethyl (B): <10 mg/dL (ref <10)

## 2017-10-22 SURGERY — REPAIR, LACERATION, 2 OR MORE
Anesthesia: General

## 2017-10-22 MED ORDER — ETOMIDATE 2 MG/ML IV SOLN
INTRAVENOUS | Status: AC | PRN
  Administered 2017-10-22: 30 mg via INTRAVENOUS

## 2017-10-22 MED ORDER — FENTANYL CITRATE (PF) 250 MCG/5ML IJ SOLN
INTRAMUSCULAR | Status: DC | PRN
  Administered 2017-10-22: 50 ug via INTRAVENOUS
  Administered 2017-10-22 – 2017-10-23 (×2): 100 ug via INTRAVENOUS

## 2017-10-22 MED ORDER — SODIUM CHLORIDE 0.9 % IV SOLN: INTRAVENOUS | Status: DC | PRN

## 2017-10-22 MED ORDER — IOPAMIDOL (ISOVUE-300) INJECTION 61%
100.0000 mL | Freq: Once | INTRAVENOUS | Status: AC | PRN
  Administered 2017-10-22: 100 mL via INTRAVENOUS

## 2017-10-22 MED ORDER — IOPAMIDOL (ISOVUE-370) INJECTION 76%: 100.0000 mL | Freq: Once | INTRAVENOUS | Status: DC | PRN

## 2017-10-22 MED ORDER — SODIUM CHLORIDE 0.9 % IV SOLN
INTRAVENOUS | Status: AC | PRN
  Administered 2017-10-22: 1000 mL via INTRAVENOUS

## 2017-10-22 MED ORDER — PROPOFOL 1000 MG/100ML IV EMUL
INTRAVENOUS | Status: AC | PRN
  Administered 2017-10-22: 21 ug/kg/min via INTRAVENOUS

## 2017-10-22 MED ORDER — ROCURONIUM BROMIDE 100 MG/10ML IV SOLN
INTRAVENOUS | Status: DC | PRN
  Administered 2017-10-22 – 2017-10-23 (×2): 50 mg via INTRAVENOUS

## 2017-10-22 MED ORDER — SODIUM CHLORIDE 0.9 % IV SOLN
INTRAVENOUS | Status: DC | PRN
  Administered 2017-10-22 – 2017-10-23 (×4): via INTRAVENOUS

## 2017-10-22 MED ORDER — CEFAZOLIN SODIUM-DEXTROSE 2-3 GM-%(50ML) IV SOLR
INTRAVENOUS | Status: DC | PRN
  Administered 2017-10-22: 2 g via INTRAVENOUS

## 2017-10-22 MED ORDER — ROCURONIUM BROMIDE 50 MG/5ML IV SOLN
INTRAVENOUS | Status: AC | PRN
  Administered 2017-10-22: 100 mg via INTRAVENOUS

## 2017-10-22 SURGICAL SUPPLY — 38 items
BLADE SURG 15 STRL LF DISP TIS (BLADE) IMPLANT
BLADE SURG 15 STRL SS (BLADE)
BNDG ELASTIC 6X15 VLCR STRL LF (GAUZE/BANDAGES/DRESSINGS) ×3 IMPLANT
CANISTER SUCT 3000ML PPV (MISCELLANEOUS) IMPLANT
CLEANER TIP ELECTROSURG 2X2 (MISCELLANEOUS) ×3 IMPLANT
CLOSURE WOUND 1/2 X4 (GAUZE/BANDAGES/DRESSINGS) ×1
COVER SURGICAL LIGHT HANDLE (MISCELLANEOUS) ×3 IMPLANT
DRESSING MEROCEL 8CM (GAUZE/BANDAGES/DRESSINGS) IMPLANT
DRESSING NASAL POPE 10X1.5X2.5 (GAUZE/BANDAGES/DRESSINGS) ×1 IMPLANT
DRSG MEROCEL 8CM (GAUZE/BANDAGES/DRESSINGS)
DRSG NASAL POPE 10X1.5X2.5 (GAUZE/BANDAGES/DRESSINGS) ×3
ELECT COATED BLADE 2.86 ST (ELECTRODE) ×3 IMPLANT
ELECT NEEDLE TIP 2.8 STRL (NEEDLE) IMPLANT
ELECT REM PT RETURN 9FT ADLT (ELECTROSURGICAL) ×3
ELECTRODE REM PT RTRN 9FT ADLT (ELECTROSURGICAL) ×1 IMPLANT
GAUZE SPONGE 4X4 16PLY XRAY LF (GAUZE/BANDAGES/DRESSINGS) IMPLANT
GLOVE BIO SURGEON STRL SZ7.5 (GLOVE) ×3 IMPLANT
GOWN STRL REUS W/ TWL LRG LVL3 (GOWN DISPOSABLE) ×2 IMPLANT
GOWN STRL REUS W/TWL LRG LVL3 (GOWN DISPOSABLE) ×6
KIT BASIN OR (CUSTOM PROCEDURE TRAY) ×3 IMPLANT
KIT ROOM TURNOVER OR (KITS) ×3 IMPLANT
NEEDLE HYPO 25GX1X1/2 BEV (NEEDLE) IMPLANT
NS IRRIG 1000ML POUR BTL (IV SOLUTION) ×3 IMPLANT
PAD ARMBOARD 7.5X6 YLW CONV (MISCELLANEOUS) ×6 IMPLANT
PENCIL BUTTON HOLSTER BLD 10FT (ELECTRODE) ×3 IMPLANT
STRIP CLOSURE SKIN 1/2X4 (GAUZE/BANDAGES/DRESSINGS) ×2 IMPLANT
SUT CHROMIC 4 0 P 3 18 (SUTURE) ×3 IMPLANT
SUT CHROMIC 5 0 P 3 (SUTURE) ×3 IMPLANT
SUT ETHILON 4 0 PS 2 18 (SUTURE) ×3 IMPLANT
SUT ETHILON 5 0 P 3 18 (SUTURE) ×3
SUT NYLON ETHILON 5-0 P-3 1X18 (SUTURE) ×1 IMPLANT
SUT PROLENE 5 0 P 3 (SUTURE) ×3 IMPLANT
SUT SILK 4 0 (SUTURE) ×3
SUT SILK 4-0 18XBRD TIE 12 (SUTURE) ×1 IMPLANT
SUT VIC AB 4-0 RB1 27 (SUTURE) ×3
SUT VIC AB 4-0 RB1 27X BRD (SUTURE) ×1 IMPLANT
TOWEL OR 17X24 6PK STRL BLUE (TOWEL DISPOSABLE) ×3 IMPLANT
TRAY ENT MC OR (CUSTOM PROCEDURE TRAY) ×3 IMPLANT

## 2017-10-22 NOTE — ED Notes (Signed)
Neurosurgery paged
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
## 2017-10-22 NOTE — ED Notes (Signed)
Facial returned call

## 2017-10-22 NOTE — Anesthesia Procedure Notes (Signed)
Arterial Line Insertion Start/End02/28/2019 11:50 PM, 10/22/2017 11:55 PM Performed by: Achille RichHodierne, Adam, MD, Adyline Huberty, Howie Illarrie B, CRNA, CRNA  Preanesthetic checklist: patient identified, IV checked and monitors and equipment checked Left, radial was placed Catheter size: 20 G Hand hygiene performed  and maximum sterile barriers used   Attempts: 1 Procedure performed without using ultrasound guided technique. Ultrasound Notes:anatomy identified Following insertion, Biopatch and dressing applied.

## 2017-10-22 NOTE — ED Notes (Signed)
Neurosurgery called back
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
## 2017-10-22 NOTE — ED Notes (Signed)
Ortho returned page  
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
## 2017-10-23 ENCOUNTER — Encounter (HOSPITAL_COMMUNITY): Payer: Self-pay | Admitting: Emergency Medicine

## 2017-10-23 ENCOUNTER — Inpatient Hospital Stay (HOSPITAL_COMMUNITY): Payer: Medicare HMO

## 2017-10-23 LAB — GLUCOSE, CAPILLARY: Glucose-Capillary: 152 mg/dL — ABNORMAL HIGH (ref 65–99)

## 2017-10-23 LAB — POCT I-STAT 7, (LYTES, BLD GAS, ICA,H+H)
ACID-BASE DEFICIT: 2 mmol/L (ref 0.0–2.0)
ACID-BASE DEFICIT: 3 mmol/L — AB (ref 0.0–2.0)
Acid-base deficit: 5 mmol/L — ABNORMAL HIGH (ref 0.0–2.0)
Acid-base deficit: 6 mmol/L — ABNORMAL HIGH (ref 0.0–2.0)
BICARBONATE: 22.3 mmol/L (ref 20.0–28.0)
BICARBONATE: 23.7 mmol/L (ref 20.0–28.0)
BICARBONATE: 24.8 mmol/L (ref 20.0–28.0)
Bicarbonate: 25 mmol/L (ref 20.0–28.0)
CALCIUM ION: 0.98 mmol/L — AB (ref 1.15–1.40)
CALCIUM ION: 0.99 mmol/L — AB (ref 1.15–1.40)
Calcium, Ion: 1.04 mmol/L — ABNORMAL LOW (ref 1.15–1.40)
Calcium, Ion: 1.09 mmol/L — ABNORMAL LOW (ref 1.15–1.40)
HCT: 22 % — ABNORMAL LOW (ref 39.0–52.0)
HCT: 29 % — ABNORMAL LOW (ref 39.0–52.0)
HCT: 30 % — ABNORMAL LOW (ref 39.0–52.0)
HCT: 36 % — ABNORMAL LOW (ref 39.0–52.0)
HEMOGLOBIN: 9.9 g/dL — AB (ref 13.0–17.0)
Hemoglobin: 10.2 g/dL — ABNORMAL LOW (ref 13.0–17.0)
Hemoglobin: 12.2 g/dL — ABNORMAL LOW (ref 13.0–17.0)
Hemoglobin: 7.5 g/dL — ABNORMAL LOW (ref 13.0–17.0)
O2 SAT: 86 %
O2 Saturation: 100 %
O2 Saturation: 100 %
O2 Saturation: 97 %
PCO2 ART: 52.6 mmHg — AB (ref 32.0–48.0)
PCO2 ART: 60.5 mmHg — AB (ref 32.0–48.0)
PH ART: 7.238 — AB (ref 7.350–7.450)
PH ART: 7.278 — AB (ref 7.350–7.450)
PO2 ART: 103 mmHg (ref 83.0–108.0)
PO2 ART: 63 mmHg — AB (ref 83.0–108.0)
Patient temperature: 35.6
Patient temperature: 36.9
Potassium: 3.1 mmol/L — ABNORMAL LOW (ref 3.5–5.1)
Potassium: 3.3 mmol/L — ABNORMAL LOW (ref 3.5–5.1)
Potassium: 3.4 mmol/L — ABNORMAL LOW (ref 3.5–5.1)
Potassium: 4.5 mmol/L (ref 3.5–5.1)
SODIUM: 141 mmol/L (ref 135–145)
SODIUM: 141 mmol/L (ref 135–145)
Sodium: 140 mmol/L (ref 135–145)
Sodium: 141 mmol/L (ref 135–145)
TCO2: 24 mmol/L (ref 22–32)
TCO2: 26 mmol/L (ref 22–32)
TCO2: 26 mmol/L (ref 22–32)
TCO2: 27 mmol/L (ref 22–32)
pCO2 arterial: 52.5 mmHg — ABNORMAL HIGH (ref 32.0–48.0)
pCO2 arterial: 58.6 mmHg — ABNORMAL HIGH (ref 32.0–48.0)
pH, Arterial: 7.2 — ABNORMAL LOW (ref 7.350–7.450)
pH, Arterial: 7.227 — ABNORMAL LOW (ref 7.350–7.450)
pO2, Arterial: 203 mmHg — ABNORMAL HIGH (ref 83.0–108.0)
pO2, Arterial: 215 mmHg — ABNORMAL HIGH (ref 83.0–108.0)

## 2017-10-23 LAB — RAPID URINE DRUG SCREEN, HOSP PERFORMED
AMPHETAMINES: NOT DETECTED
Barbiturates: NOT DETECTED
Benzodiazepines: POSITIVE — AB
COCAINE: POSITIVE — AB
OPIATES: NOT DETECTED
Tetrahydrocannabinol: NOT DETECTED

## 2017-10-23 LAB — CDS SEROLOGY

## 2017-10-23 LAB — LACTIC ACID, PLASMA: LACTIC ACID, VENOUS: 3 mmol/L — AB (ref 0.5–1.9)

## 2017-10-23 LAB — CBC
HCT: 35.8 % — ABNORMAL LOW (ref 39.0–52.0)
Hemoglobin: 11.8 g/dL — ABNORMAL LOW (ref 13.0–17.0)
MCH: 31 pg (ref 26.0–34.0)
MCHC: 33 g/dL (ref 30.0–36.0)
MCV: 94 fL (ref 78.0–100.0)
PLATELETS: 187 10*3/uL (ref 150–400)
RBC: 3.81 MIL/uL — ABNORMAL LOW (ref 4.22–5.81)
RDW: 14.8 % (ref 11.5–15.5)
WBC: 21.9 10*3/uL — ABNORMAL HIGH (ref 4.0–10.5)

## 2017-10-23 LAB — ABO/RH: ABO/RH(D): A POS

## 2017-10-23 LAB — BASIC METABOLIC PANEL
Anion gap: 9 (ref 5–15)
BUN: <5 mg/dL — ABNORMAL LOW (ref 6–20)
CALCIUM: 8 mg/dL — AB (ref 8.9–10.3)
CO2: 21 mmol/L — ABNORMAL LOW (ref 22–32)
CREATININE: 0.98 mg/dL (ref 0.61–1.24)
Chloride: 108 mmol/L (ref 101–111)
GFR calc Af Amer: >60 mL/min (ref >60)
GLUCOSE: 176 mg/dL — AB (ref 65–99)
Potassium: 4.4 mmol/L (ref 3.5–5.1)
SODIUM: 138 mmol/L (ref 135–145)

## 2017-10-23 LAB — BLOOD PRODUCT ORDER (VERBAL) VERIFICATION

## 2017-10-23 LAB — SODIUM
SODIUM: 138 mmol/L (ref 135–145)
SODIUM: 140 mmol/L (ref 135–145)
Sodium: 144 mmol/L (ref 135–145)

## 2017-10-23 LAB — HIV ANTIBODY (ROUTINE TESTING W REFLEX): HIV Screen 4th Generation wRfx: NONREACTIVE

## 2017-10-23 LAB — TRIGLYCERIDES: Triglycerides: 156 mg/dL — ABNORMAL HIGH (ref <150)

## 2017-10-23 MED ORDER — FENTANYL CITRATE (PF) 250 MCG/5ML IJ SOLN
INTRAMUSCULAR | Status: AC
  Filled 2017-10-23: qty 5

## 2017-10-23 MED ORDER — METOPROLOL TARTRATE 5 MG/5ML IV SOLN
5.0000 mg | Freq: Four times a day (QID) | INTRAVENOUS | Status: DC | PRN
  Administered 2017-10-23: 5 mg via INTRAVENOUS
  Filled 2017-10-23: qty 5

## 2017-10-23 MED ORDER — PROPOFOL 1000 MG/100ML IV EMUL
5.0000 ug/kg/min | INTRAVENOUS | Status: DC
  Filled 2017-10-23: qty 100

## 2017-10-23 MED ORDER — ORAL CARE MOUTH RINSE
15.0000 mL | OROMUCOSAL | Status: DC
  Administered 2017-10-23 – 2017-11-03 (×111): 15 mL via OROMUCOSAL

## 2017-10-23 MED ORDER — PHENYLEPHRINE HCL 10 MG/ML IJ SOLN
INTRAMUSCULAR | Status: DC | PRN
  Administered 2017-10-23: 80 ug via INTRAVENOUS

## 2017-10-23 MED ORDER — SODIUM BICARBONATE 4.2 % IV SOLN
INTRAVENOUS | Status: DC | PRN
  Administered 2017-10-23: 50 meq via INTRAVENOUS

## 2017-10-23 MED ORDER — CHLORHEXIDINE GLUCONATE 0.12% ORAL RINSE (MEDLINE KIT)
15.0000 mL | Freq: Two times a day (BID) | OROMUCOSAL | Status: DC
  Administered 2017-10-23 – 2017-11-05 (×26): 15 mL via OROMUCOSAL

## 2017-10-23 MED ORDER — LABETALOL HCL 5 MG/ML IV SOLN
10.0000 mg | INTRAVENOUS | Status: DC | PRN
  Administered 2017-10-23 – 2017-11-05 (×18): 10 mg via INTRAVENOUS
  Filled 2017-10-23 (×19): qty 4

## 2017-10-23 MED ORDER — ONDANSETRON 4 MG PO TBDP: 4.0000 mg | ORAL_TABLET | Freq: Four times a day (QID) | ORAL | Status: DC | PRN

## 2017-10-23 MED ORDER — CALCIUM CHLORIDE 10 % IV SOLN
INTRAVENOUS | Status: DC | PRN
  Administered 2017-10-23: 1 g via INTRAVENOUS

## 2017-10-23 MED ORDER — PANTOPRAZOLE SODIUM 40 MG IV SOLR
40.0000 mg | Freq: Every day | INTRAVENOUS | Status: DC
  Administered 2017-10-23 – 2017-10-27 (×4): 40 mg via INTRAVENOUS
  Filled 2017-10-23 (×4): qty 40

## 2017-10-23 MED ORDER — ACETAMINOPHEN 650 MG RE SUPP
650.0000 mg | Freq: Once | RECTAL | Status: AC
  Administered 2017-10-23: 650 mg via RECTAL
  Filled 2017-10-23: qty 1

## 2017-10-23 MED ORDER — PROPOFOL 500 MG/50ML IV EMUL
INTRAVENOUS | Status: DC | PRN
  Administered 2017-10-23: 50 ug/kg/min via INTRAVENOUS

## 2017-10-23 MED ORDER — BACITRACIN ZINC 500 UNIT/GM EX OINT
TOPICAL_OINTMENT | CUTANEOUS | Status: DC | PRN
  Administered 2017-10-23: 1 via TOPICAL

## 2017-10-23 MED ORDER — SODIUM CHLORIDE 0.9 % IV SOLN
INTRAVENOUS | Status: DC
  Administered 2017-10-23: 03:00:00 via INTRAVENOUS

## 2017-10-23 MED ORDER — PANTOPRAZOLE SODIUM 40 MG PO TBEC: 40.0000 mg | DELAYED_RELEASE_TABLET | Freq: Every day | ORAL | Status: DC

## 2017-10-23 MED ORDER — BACITRACIN ZINC 500 UNIT/GM EX OINT
TOPICAL_OINTMENT | CUTANEOUS | Status: AC
  Filled 2017-10-23: qty 28.35

## 2017-10-23 MED ORDER — CALCIUM CHLORIDE 10 % IV SOLN
INTRAVENOUS | Status: AC
  Filled 2017-10-23: qty 10

## 2017-10-23 MED ORDER — SODIUM CHLORIDE 0.9 % IV BOLUS (SEPSIS)
1000.0000 mL | Freq: Once | INTRAVENOUS | Status: AC
  Administered 2017-10-23: 1000 mL via INTRAVENOUS

## 2017-10-23 MED ORDER — ACETAMINOPHEN 160 MG/5ML PO SOLN: 650.0000 mg | Freq: Once | ORAL | Status: AC

## 2017-10-23 MED ORDER — CHLORHEXIDINE GLUCONATE CLOTH 2 % EX PADS
6.0000 | MEDICATED_PAD | Freq: Every day | CUTANEOUS | Status: DC
  Administered 2017-10-23 – 2017-10-29 (×6): 6 via TOPICAL

## 2017-10-23 MED ORDER — LABETALOL HCL 5 MG/ML IV SOLN
10.0000 mg | INTRAVENOUS | Status: DC | PRN
  Administered 2017-10-23: 20 mg via INTRAVENOUS
  Filled 2017-10-23: qty 4

## 2017-10-23 MED ORDER — SODIUM CHLORIDE 0.9 % IR SOLN
Status: DC | PRN
  Administered 2017-10-23: 1000 mL

## 2017-10-23 MED ORDER — LACTATED RINGERS IV SOLN
INTRAVENOUS | Status: DC | PRN
  Administered 2017-10-23: via INTRAVENOUS

## 2017-10-23 MED ORDER — IBUPROFEN 200 MG PO TABS
800.0000 mg | ORAL_TABLET | Freq: Once | ORAL | Status: AC
  Administered 2017-10-23: 800 mg
  Filled 2017-10-23: qty 4

## 2017-10-23 MED ORDER — SODIUM CHLORIDE 3 % IV SOLN
INTRAVENOUS | Status: DC
  Administered 2017-10-23 – 2017-10-26 (×9): 50 mL/h via INTRAVENOUS
  Filled 2017-10-23 (×19): qty 500

## 2017-10-23 MED ORDER — ONDANSETRON HCL 4 MG/2ML IJ SOLN
4.0000 mg | Freq: Four times a day (QID) | INTRAMUSCULAR | Status: DC | PRN
  Administered 2017-11-05: 4 mg via INTRAVENOUS
  Filled 2017-10-23: qty 2

## 2017-10-23 NOTE — Progress Notes (Signed)
Patient admitted from PACU. Patient has GCS of 3. CDS called and notified. No family as at this time.
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
## 2017-10-23 NOTE — Transfer of Care (Signed)
Immediate Anesthesia Transfer of Care Note  Patient: [name]  Procedure(s) Performed: COMPLEX CLOSURE FOREHEAD LACERATION 7CM, NASAL PACKING, COMPLEX CLOSURE LIPS 11 CM, CLOSED NASAL REDUCTION, BILATERAL NASAL PACKING (N/A )  Patient Location: ICU  Anesthesia Type:General  Level of Consciousness: Patient remains intubated per anesthesia plan  Airway & Oxygen Therapy: Patient remains intubated per anesthesia plan and Patient placed on Ventilator (see vital sign flow sheet for setting)  Post-op Assessment: Report given to RN and Post -op Vital signs reviewed and stable  Post vital signs: Reviewed and stable  Last Vitals:  Vitals:   10/21/2017 2333 10/23/17 0224  BP: (!) 144/88 112/78  Pulse: 99 (!) 110  Resp: (!) 26 20  Temp:  (!) 35.6 C  SpO2: (!) 80% 100%    Last Pain: There were no vitals filed for this visit.       Complications: No apparent anesthesia complications

## 2017-10-23 NOTE — Progress Notes (Addendum)
   10/23/17 2000  Clinical Encounter Type  Visited With Patient  Visit Type Initial  Referral From Nurse  Consult/Referral To Chaplain  Spiritual Encounters  Spiritual Needs Emotional  Stress Factors  Patient Stress Factors Exhausted  Family Stress Factors Exhausted    This was a referral from the previous on-call chaplain. Direct care nurse was on-site when I visited. No family available but nurse said that the friend was aware and was coming the next Tuesday. The cousin had also been notified and she was likely to show up any time. Patient still intubated and seemingly very sick. Chaplain Provided emotional support and compassionate presence.  [name] a Water quality scientistMusiko-Holley, E. I. du PontChaplain
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
## 2017-10-23 NOTE — Progress Notes (Signed)
   Subjective:    Patient ID: [name], male    DOB: 12/21/1970, 47 y.o.   MRN: [id number]  HPI Remains comatose on mechanical ventilator.  Some response to pain.  Review of Systems     Objective:   Physical Exam Intubated.  Does not respond to voice. Nasal splint and nasal packs in place.  Facial wounds intact.    Assessment & Plan:  Facial wounds, nasal fracture, epistaxis  Will take nasal packs out in 2-3 more days.  Will plan suture and splint removal at 7 days.
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
## 2017-10-23 NOTE — Progress Notes (Signed)
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 left and mass effect on the right lateral ventricle. Bilateral intraventricular hemorrhage, right greater than left. Scattered foci of subdural hematoma measuring up to 4 mm in thickness along the right anterior falx. Additional scattered foci of subarachnoid hemorrhage or contusion at the left cranial vertex with small amount of subarachnoid blood at the right occipital lobe. 2. 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 Mild degenerative changes of the cervical spine without acute abnormality 4. Acute comminuted and displaced bilateral nasal bone fracture with additional angular deformity of the nasal septum. Fracture through the left anterior maxilla that involves the alveolar ridge with multiple tooth avulsion involving the right and left central incisors and left lateral incisor teeth. Retained tooth fragment in the anterior oral cavity. Tooth fracture involving the left maxillary canine tooth. Overall poor dentition with multiple root lucency in the mandible and maxilla. 5. Fluid levels in the sinuses which are hemorrhagic and may be related to the nasal bone fractures. Sphenoid sinus fluid levels without definitive lucency seen however occult central skull base fracture is considered. 6. Airspace disease in the apex of the right lung suspect for contusion 7. Large  forehead laceration and swelling over the nasal area Critical Value/emergent results were called by telephone at the time of interpretation on 10/23/2017 at 11:39 pm to Dr. Corliss Skains, who verbally acknowledged these results. Electronically Signed   By: Jasmine Pang M.D.   On: 10/23/17 23:39    Assessment/Plan: Right basal ganglia hemorrhage: the hemorrhage is not operable.The patient has improved slightly. I'll add 3% sodium. I'll plan to repeat his CAT scan tomorrow to check for hydrocephalus.  LOS: 1 day     Cristi Loron 10/23/2017, 9:50 AM
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
## 2017-10-23 NOTE — Progress Notes (Signed)
Patient waking up, breathing above the ventilator. No order for sedation. MD paged to notify at 5:30 an and paged again now. Still awaiting a call back.
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
## 2017-10-23 NOTE — Progress Notes (Signed)
Result of lactic acid 3.0 called to RN, MD on call (Dr.Tsuei paged) awaiting a call back

## 2017-10-23 NOTE — Anesthesia Preprocedure Evaluation (Signed)
Anesthesia Evaluation  Patient identified by MRN, date of birth, ID band Patient unresponsive  General Assessment Comment:Pt arrived emergently from ER already intubated.  Reviewed: Unable to perform ROS - Chart review onlyPreop documentation limited or incomplete due to emergent nature of procedure.  Airway Mallampati: Intubated   Neck ROM: limited   Comment: Intubated. In C-collar Dental   Pulmonary  Intubated.  Sats 88% upon arrival    + decreased breath sounds      Cardiovascular  Rhythm:regular Rate:Tachycardia  Tachycardic, acidotic   Neuro/Psych    GI/Hepatic   Endo/Other    Renal/GU      Musculoskeletal   Abdominal   Peds  Hematology   Anesthesia Other Findings   Reproductive/Obstetrics                             Anesthesia Physical Anesthesia Plan  ASA: II and emergent  Anesthesia Plan: General   Post-op Pain Management:    Induction: Intravenous  PONV Risk Score and Plan: 2 and Ondansetron, Treatment may vary due to age or medical condition and Midazolam  Airway Management Planned: Oral ETT  Additional Equipment: Arterial line and CVP  Intra-op Plan:   Post-operative Plan: Post-operative intubation/ventilation  Informed Consent: I have reviewed the patients History and Physical, chart, labs and discussed the procedure including the risks, benefits and alternatives for the proposed anesthesia with the patient or authorized representative who has indicated his/her understanding and acceptance.     Plan Discussed with: CRNA, Anesthesiologist and Surgeon  Anesthesia Plan Comments:         Anesthesia Quick Evaluation

## 2017-10-23 NOTE — Anesthesia Procedure Notes (Signed)
Central Venous Catheter Insertion Performed by: Achille RichHodierne, Bevin Das, MD, anesthesiologist Start/End2/24/2019 12:02 AM, 10/23/2017 12:12 AM Patient location: Pre-op. Preanesthetic checklist: patient identified, IV checked, site marked, risks and benefits discussed, surgical consent, monitors and equipment checked, pre-op evaluation, timeout performed and anesthesia consent Position: Trendelenburg Lidocaine 1% used for infiltration and patient sedated Hand hygiene performed  and maximum sterile barriers used  Catheter size: 7 Fr Total catheter length 16. Central line was placed.Double lumen Procedure performed without using ultrasound guided technique. Attempts: 1 Following insertion, dressing applied, line sutured and Biopatch. Post procedure assessment: blood return through all ports, free fluid flow and no air  Patient tolerated the procedure well with no immediate complications.

## 2017-10-24 ENCOUNTER — Encounter (HOSPITAL_COMMUNITY): Payer: Self-pay | Admitting: Otolaryngology

## 2017-10-24 ENCOUNTER — Inpatient Hospital Stay (HOSPITAL_COMMUNITY): Payer: Medicare HMO

## 2017-10-24 LAB — BPAM RBC
BLOOD PRODUCT EXPIRATION DATE: 201903072359
BLOOD PRODUCT EXPIRATION DATE: 201903112359
Blood Product Expiration Date: 201903072359
Blood Product Expiration Date: 201903112359
ISSUE DATE / TIME: 201902240106
ISSUE DATE / TIME: 201902241700
ISSUE DATE / TIME: 201902232132
ISSUE DATE / TIME: 201902240106
UNIT TYPE AND RH: 9500
UNIT TYPE AND RH: 9500
Unit Type and Rh: 6200
Unit Type and Rh: 6200

## 2017-10-24 LAB — BLOOD GAS, ARTERIAL
Acid-base deficit: 1.8 mmol/L (ref 0.0–2.0)
Bicarbonate: 22.2 mmol/L (ref 20.0–28.0)
DRAWN BY: 520751
FIO2: 40
LHR: 18 {breaths}/min
MECHVT: 620 mL
O2 SAT: 98.8 %
PATIENT TEMPERATURE: 98.6
PCO2 ART: 35.5 mmHg (ref 32.0–48.0)
PEEP: 8 cmH2O
PO2 ART: 114 mmHg — AB (ref 83.0–108.0)
pH, Arterial: 7.412 (ref 7.350–7.450)

## 2017-10-24 LAB — TYPE AND SCREEN
ABO/RH(D): A POS
Antibody Screen: NEGATIVE
UNIT DIVISION: 0
UNIT DIVISION: 0
Unit division: 0
Unit division: 0

## 2017-10-24 LAB — BASIC METABOLIC PANEL
ANION GAP: 8 (ref 5–15)
BUN: 14 mg/dL (ref 6–20)
CALCIUM: 7.9 mg/dL — AB (ref 8.9–10.3)
CO2: 20 mmol/L — ABNORMAL LOW (ref 22–32)
Chloride: 117 mmol/L — ABNORMAL HIGH (ref 101–111)
Creatinine, Ser: 1.22 mg/dL (ref 0.61–1.24)
Glucose, Bld: 152 mg/dL — ABNORMAL HIGH (ref 65–99)
POTASSIUM: 4.4 mmol/L (ref 3.5–5.1)
Sodium: 145 mmol/L (ref 135–145)

## 2017-10-24 LAB — CBC WITH DIFFERENTIAL/PLATELET
BASOS ABS: 0.1 10*3/uL (ref 0.0–0.1)
BASOS PCT: 0 %
Eosinophils Absolute: 0.2 10*3/uL (ref 0.0–0.7)
Eosinophils Relative: 2 %
HEMATOCRIT: 29.3 % — AB (ref 39.0–52.0)
HEMOGLOBIN: 9.7 g/dL — AB (ref 13.0–17.0)
LYMPHS PCT: 19 %
Lymphs Abs: 2.7 10*3/uL (ref 0.7–4.0)
MCH: 30.8 pg (ref 26.0–34.0)
MCHC: 33.1 g/dL (ref 30.0–36.0)
MCV: 93 fL (ref 78.0–100.0)
MONO ABS: 0.8 10*3/uL (ref 0.1–1.0)
Monocytes Relative: 6 %
NEUTROS ABS: 10.4 10*3/uL — AB (ref 1.7–7.7)
NEUTROS PCT: 73 %
Platelets: 141 10*3/uL — ABNORMAL LOW (ref 150–400)
RBC: 3.15 MIL/uL — AB (ref 4.22–5.81)
RDW: 15.9 % — AB (ref 11.5–15.5)
WBC: 14.1 10*3/uL — AB (ref 4.0–10.5)

## 2017-10-24 LAB — LACTIC ACID, PLASMA: LACTIC ACID, VENOUS: 1.8 mmol/L (ref 0.5–1.9)

## 2017-10-24 LAB — SODIUM: SODIUM: 147 mmol/L — AB (ref 135–145)

## 2017-10-24 LAB — TRIGLYCERIDES: Triglycerides: 95 mg/dL (ref <150)

## 2017-10-24 MED ORDER — PIVOT 1.5 CAL PO LIQD
1000.0000 mL | ORAL | Status: DC
  Administered 2017-10-24 – 2017-10-26 (×3): 1000 mL

## 2017-10-24 MED ORDER — CEFAZOLIN SODIUM-DEXTROSE 2-4 GM/100ML-% IV SOLN
2.0000 g | INTRAVENOUS | Status: AC
  Filled 2017-10-24: qty 100

## 2017-10-24 MED ORDER — PROPOFOL 1000 MG/100ML IV EMUL
5.0000 ug/kg/min | INTRAVENOUS | Status: DC
  Administered 2017-10-24: 30 ug/kg/min via INTRAVENOUS
  Administered 2017-10-24 (×2): 5 ug/kg/min via INTRAVENOUS
  Administered 2017-10-25 (×2): 15 ug/kg/min via INTRAVENOUS
  Administered 2017-10-25: 30 ug/kg/min via INTRAVENOUS
  Administered 2017-10-26: 15 ug/kg/min via INTRAVENOUS
  Administered 2017-10-26 (×2): 20 ug/kg/min via INTRAVENOUS
  Administered 2017-10-26: 15 ug/kg/min via INTRAVENOUS
  Filled 2017-10-24 (×9): qty 100

## 2017-10-24 MED ORDER — SODIUM CHLORIDE 0.9 % IV BOLUS (SEPSIS)
1000.0000 mL | Freq: Once | INTRAVENOUS | Status: AC
  Administered 2017-10-24: 1000 mL via INTRAVENOUS

## 2017-10-24 NOTE — Progress Notes (Signed)
MD called back and new orders received. 

## 2017-10-24 NOTE — Progress Notes (Signed)
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 callosum. Atrophy. Basilar cisterns and fourth ventricle are patent. Encephalomalacia in the right temporal lobe. Vascular: No hyperdense vessels. Scattered calcifications at the carotid siphons. Skull: Right temporoparietal craniotomy. No definite acute skull fracture is seen. Other: None CT MAXILLOFACIAL FINDINGS Osseous: Mastoid air cells are clear. Mandibular heads are normally position. No mandibular fracture is seen. Acute comminuted depressed and displaced nasal bone fracture bilaterally. Fracture through the nasal septum with left angular deformity at the mid to posterior aspect of the septum. Zygomatic arches and pterygoid plates are intact. Poor dentition with multiple root lucency in the maxilla. Fracture through the left maxillary bone, best seen on coronal views, through the alveolar ridge. Suspected multiple tooth avulsion involving left central and lateral incisor with horizontally oriented tooth fragment in the anterior oral cavity. Fracture through the left maxillary canine tooth. Multiple root lucency within the left mandibular premolar and molar teeth. Dental caries or additional tooth fracture at the left mandibular and maxillary molar teeth. Dental caries versus tooth fractures right premolar and molar mandibular teeth. Possible small tooth fragment adjacent  to the right second molar tooth. Multiple absent right maxillary teeth with dental caries noted. Orbits: No orbital wall fracture. Intra-ocular muscles are normal in position. The globes appear intact. Sinuses: Fluid level in the sphenoid sinuses without discrete skull base fracture lucency. Fluid level in the right maxillary sinus with small amount of hemorrhage. No definite displaced sinus wall fracture. Debris in the nasal passage. Mucosal opacification of the ethmoid sinuses. Soft tissues: Large forehead laceration. Swelling over the nasal area. CT CERVICAL SPINE FINDINGS Alignment: No subluxation.  Facet alignment is within normal limits. Skull base and vertebrae: No acute fracture. No primary bone lesion or focal pathologic process. Soft tissues and spinal canal: No prevertebral fluid or swelling. No visible canal hematoma. Disc levels:  Mild degenerative changes at C5-C6. Upper chest: Airspace disease at the apical portion of the right lung may reflect contusion. No pneumothorax at the apices. Endotracheal tube tip near the carina. No thyroid mass. Other: None IMPRESSION: 1. Large acute intraparenchymal hematoma centered within the right basal ganglia measuring 6.4 x 3.6 cm. There is about 3.5 mm midline shift to the left and mass effect on the right lateral ventricle. Bilateral intraventricular hemorrhage, right greater than left. Scattered foci of subdural hematoma measuring up to 4 mm in thickness along the right anterior falx. Additional scattered foci of subarachnoid hemorrhage or contusion at the left cranial vertex with small amount of subarachnoid blood at the right occipital lobe. 2. 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 Mild degenerative changes of the cervical spine without acute abnormality 4. Acute comminuted and displaced bilateral nasal bone fracture with additional angular deformity of the nasal septum. Fracture through the left anterior maxilla that involves the  alveolar ridge with multiple tooth avulsion involving the right and left central incisors and left lateral incisor teeth. Retained tooth fragment in the anterior oral cavity. Tooth fracture involving the left maxillary canine tooth. Overall poor dentition with multiple root lucency in the mandible and maxilla. 5. Fluid levels in the sinuses which are hemorrhagic and may be related to the nasal bone fractures. Sphenoid sinus fluid levels without definitive lucency seen however occult central skull base fracture is considered. 6. Airspace disease in the apex of  the right lung suspect for contusion 7. Large forehead laceration and swelling over the nasal area Critical Value/emergent results were called by telephone at the time of interpretation on 10/21/2017 at 11:39 pm to Dr. Corliss Skains, who verbally acknowledged these results. Electronically Signed   By: Jasmine Pang M.D.   On: 10/19/2017 23:39    Assessment/Plan: Right basal going hemorrhage, intraventricular hemorrhage: The patient scan looks a little worse but clinically he has a little better since admission.  This is a nonoperable hemorrhage.  He does not have any significant hydrocephalus so he does not need a ventriculostomy presently.  We will continue with supportive care.  I discussed the situation with Dr. Lindie Spruce.  LOS: 2 days     Cristi Loron 10/24/2017, 11:21 AM

## 2017-10-24 NOTE — Progress Notes (Signed)
Patient transported on vent from 4N-22 to CT and back without complication.

## 2017-10-24 NOTE — Progress Notes (Signed)
Follow up - Trauma and Critical Care  Patient Details:    [name] is an 47 y.o. male.  Lines/tubes : Airway 7.5 mm (Active)  Secured at (cm) 24 cm 10/24/2017  3:15 AM  Measured From Lips 10/24/2017  3:15 AM  Secured Location Right 10/24/2017  3:15 AM  Secured By [employer] 10/24/2017  3:15 AM  Tube Holder Repositioned Yes 10/24/2017  3:15 AM  Cuff Pressure (cm H2O) 22 cm H2O 10/23/2017 11:16 PM  Site Condition Dry 10/23/2017  3:29 PM     CVC Double Lumen 10/23/17 Left Subclavian 16 cm (Active)  Indication for Insertion or Continuance of Line Prolonged intravenous therapies 10/23/2017  8:00 PM  Site Assessment Intact;Dry;Clean 10/23/2017  8:00 PM  Proximal Lumen Status Saline locked 10/23/2017  8:00 AM  Distal Lumen Status Infusing 10/23/2017  8:00 AM  Dressing Type Transparent;Occlusive 10/23/2017  8:00 PM  Dressing Status Clean;Dry;Antimicrobial disc in place 10/23/2017  8:00 PM  Line Care Connections checked and tightened 10/23/2017  8:00 PM  Dressing Change Due 10/29/17 10/23/2017  8:00 PM     Arterial Line 10/14/2017 Radial (Active)  Site Assessment Clean;Dry;Intact 10/23/2017  8:00 PM  Art Line Waveform Appropriate 10/23/2017  8:00 PM  Art Line Interventions Leveled;Connections checked and tightened 10/23/2017  8:00 AM  Color/Movement/Sensation Capillary refill greater than 3 sec 10/23/2017  8:00 PM  Dressing Type Transparent;Occlusive 10/23/2017  8:00 PM  Dressing Status Clean;Dry;Intact;Antimicrobial disc in place 10/23/2017  8:00 PM  Dressing Change Due 10/29/17 10/23/2017  8:00 PM     NG/OG Tube Orogastric 16 Fr. Right mouth Aucultation (Active)  Site Assessment Clean;Dry;Intact 10/23/2017  8:00 PM  Ongoing Placement Verification Xray 10/23/2017  8:00 PM  Status Suction-low intermittent 10/23/2017  8:00 PM  Drainage Appearance Green 10/23/2017  8:00 PM  Output (mL) 300 mL 10/24/2017  5:00 AM     Urethral Catheter Jessica Ferrainolo Temperature probe 16 Fr. (Active)  Indication  for Insertion or Continuance of Catheter Unstable critical patients (first 24-48 hours) 10/23/2017  8:00 PM  Site Assessment Clean;Intact 10/23/2017  8:00 PM  Catheter Maintenance Bag below level of bladder;Catheter secured;Drainage bag/tubing not touching floor;Insertion date on drainage bag 10/23/2017  8:00 PM  Collection Container Standard drainage bag 10/23/2017  8:00 PM  Securement Method Leg strap 10/23/2017  8:00 PM  Urinary Catheter Interventions Unclamped 10/23/2017  8:00 PM  Output (mL) 250 mL 10/24/2017  5:00 AM    Microbiology/Sepsis markers: No results found for this or any previous visit.  Anti-infectives:  Anti-infectives (From admission, onward)   None      Best Practice/Protocols:  VTE Prophylaxis: Mechanical GI Prophylaxis: Proton Pump Inhibitor Continous Sedation  Consults: Treatment Team:  Roby Lofts, MD Tressie Stalker, MD    Events:  Subjective:    Overnight Issues: Patient due to go to surgery for his tib-fib fracture today.  Objective:  Vital signs for last 24 hours: Temp:  [97.2 F (36.2 C)-102.7 F (39.3 C)] 97.9 F (36.6 C) (02/25 0500) Pulse Rate:  [75-132] 90 (02/25 0700) Resp:  [0-32] 18 (02/25 0700) BP: (92-167)/(54-101) 125/75 (02/25 0500) SpO2:  [98 %-100 %] 100 % (02/25 0700) Arterial Line BP: (78-250)/(48-97) 135/63 (02/25 0700) FiO2 (%):  [50 %-60 %] 50 % (02/25 0315)  Hemodynamic parameters for last 24 hours:    Intake/Output from previous day: 02/24 0701 - 02/25 0700 In: 1725.4 [I.V.:1725.4] Out: 800 [Urine:500; Emesis/NG output:300]  Intake/Output this shift: No intake/output data recorded.  Vent settings for last  24 hours: Vent Mode: PRVC FiO2 (%):  [50 %-60 %] 50 % Set Rate:  [18 bmp] 18 bmp Vt Set:  [130 mL] 620 mL PEEP:  [8 cmH20-10 cmH20] 8 cmH20 Plateau Pressure:  [16 cmH20-22 cmH20] 16 cmH20  Physical Exam:  General: no respiratory distress Neuro: RASS -2, weakness right upper extremity, weakness right  lower extremity and more right sided neglect.  Postures Resp: clear to auscultation bilaterally and CXR shows decreased volumes on the right.  ABG  pending. CVS: regular rate and rhythm, S1, S2 normal, no murmur, click, rub or gallop GI: soft, nontender, BS WNL, no r/g and Not getting any tube feedings. Extremities: Left leg in splint .  No pulse deficitis.    Results for orders placed or performed during the hospital encounter of 10/20/2017 (from the past 24 hour(s))  Sodium     Status: None   Collection Time: 10/23/17 10:01 AM  Result Value Ref Range   Sodium 138 135 - 145 mmol/L  Provider-confirm verbal Blood Bank order - RBC, FFP, Type & Screen; 4 Units; Order taken: [id number]; [id number]; Level 1 Trauma, Emergency Release Accession [id number] and [phone] for 2 units of RBCs and 2 units of FFP emergently released. All 4 units returned ...     Status: None   Collection Time: 10/23/17 12:30 PM  Result Value Ref Range   Blood product order confirm      MD AUTHORIZATION REQUESTED Performed at [facility], [address] [place], [place] [address]   Sodium     Status: None   Collection Time: 10/23/17  4:30 PM  Result Value Ref Range   Sodium 140 135 - 145 mmol/L  Glucose, capillary     Status: Abnormal   Collection Time: 10/23/17  9:10 PM  Result Value Ref Range   Glucose-Capillary 152 (H) 65 - 99 mg/dL  Sodium     Status: None   Collection Time: 10/23/17  9:53 PM  Result Value Ref Range   Sodium 144 135 - 145 mmol/L  Triglycerides     Status: None   Collection Time: 10/24/17  3:38 AM  Result Value Ref Range   Triglycerides 95 <150 mg/dL     Assessment/Plan:   NEURO  Altered Mental Status:  obtundation and sedation   Plan: Worsening CT head .  On hypertonic saline solution.  Na 144  PULM  Atelectasis/collapse (focal and right sided.)   Plan: ABg and CXR pending from this morning.  CARDIO  No specific issues   Plan: CPM  RENAL  Oliguria (probably hypovolemia) and renal function  is good.   Plan: bolus with saline and albumin  GI  No known problem, but concerned about instability and dropping hemoglobin.  Questionable splenic injur and free fluid in the abdomen   Plan: CPM, no blood.  Hold tube feedigns until after surgery today.  ID  No known infectious sources.   Plan: CPM  HEME  Anemia acute blood loss anemia)   Plan: No need for transfusion currrently, b ut does need volume  ENDO Hypernatremia (moderate (146 - 155 meq/dl) and hypertonic saline infusion) No other concerns   Plan: Check lines.  Global Issues  Clinically the patient is still neurologically significantly impaired.  Will repeat head CT in a couple of days.  NS does not feel as though pressure monitoring is warranted at this point and ventriculostomy would clot..  To go for surgery later this morning to fix his leg.  LOS: 2 days   Additional comments:I reviewed the patient's new clinical lab test results. cbc/bmet/abg all pending. and I reviewed the patients new imaging test results. CXR pending. No family available to discuss his care. Critical Care Total Time*: 45 Minutes  Jimmye NormanJames Sunita Demond 10/24/2017  *Care during the described time interval was provided by me and/or other providers on the critical care team.  I have reviewed this patient's available data, including medical history, events of note, physical examination and test results as part of my evaluation.
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
## 2017-10-24 NOTE — Anesthesia Postprocedure Evaluation (Signed)
Anesthesia Post Note  Patient: [name]  Procedure(s) Performed: COMPLEX CLOSURE FOREHEAD LACERATION 7CM, NASAL PACKING, COMPLEX CLOSURE LIPS 11 CM, CLOSED NASAL REDUCTION, BILATERAL NASAL PACKING (N/A )     Patient location during evaluation: SICU Anesthesia Type: General Level of consciousness: sedated Pain management: pain level controlled Vital Signs Assessment: post-procedure vital signs reviewed and stable Respiratory status: patient remains intubated per anesthesia plan Cardiovascular status: stable Postop Assessment: no apparent nausea or vomiting Anesthetic complications: no    Last Vitals:  Vitals:   10/24/17 0600 10/24/17 0700  BP:    Pulse: (!) 132 90  Resp: (!) 32 18  Temp:    SpO2: 100% 100%    Last Pain:  Vitals:   10/23/17 1400  TempSrc: Core                 Frannie Shedrick S

## 2017-10-24 NOTE — Progress Notes (Signed)
Patient noted breathing over the vent, MD paged.
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
## 2017-10-24 NOTE — Progress Notes (Addendum)
Spoke with the patients Navistar International CorporationUncle Mark Holdaday who [place]. Contact [phone].  He stated he did not want to be responsible for making medical decisions for the patient due to not driving and having liver cancer and was concerned for soon approaching death and wanted the patients partner Lelon HuhJared Ware to make all medical decisions for him. Jared at the bedside and made aware of his responsibility to make medical decisions.

## 2017-10-24 NOTE — Progress Notes (Signed)
Dr Jena GaussHaddix made aware of patient's pulse trending to sinus brady over last couple of hours with the lowest rate of 49 but sustaining in the mid 50s. Neurologically he has the same assessment. Dr Jena GaussHaddix stated he would postpone his surgery to mid week due to CT worsening and concerns of worsening bradycardia. Updated family at the bedside. Will continue to monitor.

## 2017-10-25 ENCOUNTER — Inpatient Hospital Stay (HOSPITAL_COMMUNITY): Payer: Medicare HMO

## 2017-10-25 LAB — BASIC METABOLIC PANEL
Anion gap: 5 (ref 5–15)
BUN: 14 mg/dL (ref 6–20)
CALCIUM: 8 mg/dL — AB (ref 8.9–10.3)
CO2: 21 mmol/L — AB (ref 22–32)
CREATININE: 1.11 mg/dL (ref 0.61–1.24)
Chloride: 125 mmol/L — ABNORMAL HIGH (ref 101–111)
Glucose, Bld: 172 mg/dL — ABNORMAL HIGH (ref 65–99)
Potassium: 4.1 mmol/L (ref 3.5–5.1)
SODIUM: 151 mmol/L — AB (ref 135–145)

## 2017-10-25 LAB — CBC WITH DIFFERENTIAL/PLATELET
BASOS ABS: 0 10*3/uL (ref 0.0–0.1)
BASOS PCT: 0 %
EOS ABS: 0.1 10*3/uL (ref 0.0–0.7)
EOS PCT: 1 %
HCT: 28.4 % — ABNORMAL LOW (ref 39.0–52.0)
Hemoglobin: 9.3 g/dL — ABNORMAL LOW (ref 13.0–17.0)
Lymphocytes Relative: 16 %
Lymphs Abs: 2.8 10*3/uL (ref 0.7–4.0)
MCH: 31.3 pg (ref 26.0–34.0)
MCHC: 32.7 g/dL (ref 30.0–36.0)
MCV: 95.6 fL (ref 78.0–100.0)
MONO ABS: 1.1 10*3/uL — AB (ref 0.1–1.0)
MONOS PCT: 6 %
Neutro Abs: 13.8 10*3/uL — ABNORMAL HIGH (ref 1.7–7.7)
Neutrophils Relative %: 77 %
PLATELETS: 165 10*3/uL (ref 150–400)
RBC: 2.97 MIL/uL — ABNORMAL LOW (ref 4.22–5.81)
RDW: 16.5 % — AB (ref 11.5–15.5)
WBC: 17.7 10*3/uL — ABNORMAL HIGH (ref 4.0–10.5)

## 2017-10-25 LAB — SODIUM
SODIUM: 150 mmol/L — AB (ref 135–145)
SODIUM: 152 mmol/L — AB (ref 135–145)
SODIUM: 152 mmol/L — AB (ref 135–145)
Sodium: 155 mmol/L — ABNORMAL HIGH (ref 135–145)

## 2017-10-25 MED ORDER — FREE WATER
200.0000 mL | Freq: Three times a day (TID) | Status: DC
  Administered 2017-10-25 – 2017-10-28 (×9): 200 mL

## 2017-10-25 MED ORDER — ALBUMIN HUMAN 5 % IV SOLN
12.5000 g | Freq: Once | INTRAVENOUS | Status: AC
  Administered 2017-10-25: 12.5 g via INTRAVENOUS
  Filled 2017-10-25: qty 500

## 2017-10-25 NOTE — Progress Notes (Signed)
Follow up - Trauma Critical Care  Patient Details:    [name] is an 47 y.o. male.  Lines/tubes : Airway 7.5 mm (Active)  Secured at (cm) 25 cm 10/25/2017  8:17 AM  Measured From Lips 10/25/2017  8:17 AM  Secured Location Right 10/25/2017  8:17 AM  Secured By [employer] Tube Holder 10/25/2017  8:17 AM  Tube Holder Repositioned Yes 10/25/2017  8:17 AM  Cuff Pressure (cm H2O) 26 cm H2O 10/24/2017  7:45 PM  Site Condition Dry 10/25/2017  8:17 AM     CVC Double Lumen 10/23/17 Left Subclavian 16 cm (Active)  Indication for Insertion or Continuance of Line Prolonged intravenous therapies;Administration of hyperosmolar/irritating solutions (i.e. TPN, Vancomycin, etc.) 10/24/2017  8:00 PM  Site Assessment Clean;Dry;Intact 10/24/2017  8:00 PM  Proximal Lumen Status Infusing 10/24/2017  8:00 PM  Distal Lumen Status Infusing;In-line blood sampling system in place 10/24/2017  8:00 PM  Dressing Type Transparent;Occlusive 10/24/2017  8:00 PM  Dressing Status Clean;Dry;Antimicrobial disc in place 10/24/2017  8:00 PM  Line Care Connections checked and tightened 10/24/2017  8:00 PM  Dressing Change Due 10/31/17 10/24/2017  8:00 PM     Arterial Line 10/05/2017 Radial (Active)  Site Assessment Clean;Dry;Intact 10/24/2017  8:00 PM  Line Status Positional 10/24/2017  8:00 PM  Art Line Waveform Appropriate;Whip 10/24/2017  8:00 PM  Art Line Interventions Leveled;Zeroed and calibrated 10/24/2017  8:00 PM  Color/Movement/Sensation Capillary refill greater than 3 sec 10/24/2017  8:00 PM  Dressing Type Transparent;Occlusive 10/24/2017  8:00 PM  Dressing Status Clean;Dry;Intact;Antimicrobial disc in place 10/24/2017  8:00 PM  Dressing Change Due 10/29/17 10/24/2017  8:00 PM     NG/OG Tube Orogastric 16 Fr. Right mouth Aucultation (Active)  Site Assessment Clean;Dry;Intact 10/24/2017  8:00 PM  Ongoing Placement Verification No acute changes, not attributed to clinical condition;No change in respiratory status;No change in cm  markings or external length of tube from initial placement;Xray 10/24/2017  8:00 PM  Status Infusing tube feed 10/24/2017  8:00 PM  Drainage Appearance Bile 10/24/2017  8:00 AM  Output (mL) 300 mL 10/24/2017  5:00 AM     Urethral Catheter Jessica Ferrainolo Temperature probe 16 Fr. (Active)  Indication for Insertion or Continuance of Catheter Other (comment) 10/24/2017  8:00 PM  Site Assessment Clean;Intact 10/24/2017  8:00 PM  Catheter Maintenance Bag below level of bladder;Catheter secured;Drainage bag/tubing not touching floor;Insertion date on drainage bag;No dependent loops;Seal intact 10/24/2017  8:00 PM  Collection Container Standard drainage bag 10/24/2017  8:00 PM  Securement Method Securing device (Describe) 10/24/2017  8:00 PM  Urinary Catheter Interventions Unclamped 10/24/2017  8:00 AM  Output (mL) 65 mL 10/25/2017  6:00 AM    Microbiology/Sepsis markers: No results found for this or any previous visit.  Anti-infectives:  Anti-infectives (From admission, onward)   Start     Dose/Rate Route Frequency Ordered Stop   10/24/17 1100  ceFAZolin (ANCEF) IVPB 2g/100 mL premix     2 g 200 mL/hr over 30 Minutes Intravenous On call to O.R. 10/24/17 1007 10/25/17 0559      Best Practice/Protocols:  VTE Prophylaxis: Mechanical Continous Sedation  Consults: Treatment Team:  Roby LoftsHaddix, Kevin P, MD Tressie StalkerJenkins, Jeffrey, MD Coletta Memosabbell, Kyle, MD    Studies:    Events:  Subjective:    Overnight Issues:   Objective:  Vital signs for last 24 hours: Temp:  [98.1 F (36.7 C)-100.6 F (38.1 C)] 99.7 F (37.6 C) (02/26 0600) Pulse Rate:  [53-120] 66 (02/26 0816) Resp:  [15-24]  18 (02/26 0600) BP: (130-205)/(59-110) 172/72 (02/26 0816) SpO2:  [97 %-100 %] 100 % (02/26 0600) Arterial Line BP: (128-231)/(57-98) 196/78 (02/26 0600) FiO2 (%):  [35 %-50 %] 35 % (02/26 0817)  Hemodynamic parameters for last 24 hours: CVP:  [15 mmHg-18 mmHg] 18 mmHg  Intake/Output from previous day: 02/25  0701 - 02/26 0700 In: 1710.2 [I.V.:1422.2; NG/GT:288] Out: 1325 [Urine:1325]  Intake/Output this shift: No intake/output data recorded.  Vent settings for last 24 hours: Vent Mode: CPAP FiO2 (%):  [35 %-50 %] 35 % Set Rate:  [18 bmp] 18 bmp Vt Set:  [620 mL] 620 mL PEEP:  [5 cmH20-8 cmH20] 5 cmH20 Pressure Support:  [5 cmH20] 5 cmH20 Plateau Pressure:  [16 cmH20-19 cmH20] 16 cmH20  Physical Exam:  General: on vent Neuro: pupils 2mm, ext posture R side to stim HEENT/Neck: ETT and collar Resp: clear to auscultation bilaterally CVS: RRR GI: soft, nontender, BS WNL, no r/g Skin: no rash, chronic stasis changes RLE, LLE in splint  Results for orders placed or performed during the hospital encounter of 10/06/2017 (from the past 24 hour(s))  Blood gas, arterial     Status: Abnormal   Collection Time: 10/24/17  9:35 AM  Result Value Ref Range   FIO2 40.00    Delivery systems VENTILATOR    Mode PRESSURE REGULATED VOLUME CONTROL    VT 620 mL   LHR 18 resp/min   Peep/cpap 8.0 cm H20   pH, Arterial 7.412 7.350 - 7.450   pCO2 arterial 35.5 32.0 - 48.0 mmHg   pO2, Arterial 114 (H) 83.0 - 108.0 mmHg   Bicarbonate 22.2 20.0 - 28.0 mmol/L   Acid-base deficit 1.8 0.0 - 2.0 mmol/L   O2 Saturation 98.8 %   Patient temperature 98.6    Collection site A-LINE    Drawn by [id number]    Sample type ARTERIAL DRAW   Sodium     Status: Abnormal   Collection Time: 10/24/17  4:00 PM  Result Value Ref Range   Sodium 147 (H) 135 - 145 mmol/L  Sodium     Status: Abnormal   Collection Time: 10/24/17 11:15 PM  Result Value Ref Range   Sodium 150 (H) 135 - 145 mmol/L  CBC with Differential/Platelet     Status: Abnormal   Collection Time: 10/25/17  3:53 AM  Result Value Ref Range   WBC 17.7 (H) 4.0 - 10.5 K/uL   RBC 2.97 (L) 4.22 - 5.81 MIL/uL   Hemoglobin 9.3 (L) 13.0 - 17.0 g/dL   HCT 04.5 (L) 40.9 - 81.1 %   MCV 95.6 78.0 - 100.0 fL   MCH 31.3 26.0 - 34.0 pg   MCHC 32.7 30.0 - 36.0 g/dL   RDW  91.4 (H) 78.2 - 15.5 %   Platelets 165 150 - 400 K/uL   Neutrophils Relative % 77 %   Neutro Abs 13.8 (H) 1.7 - 7.7 K/uL   Lymphocytes Relative 16 %   Lymphs Abs 2.8 0.7 - 4.0 K/uL   Monocytes Relative 6 %   Monocytes Absolute 1.1 (H) 0.1 - 1.0 K/uL   Eosinophils Relative 1 %   Eosinophils Absolute 0.1 0.0 - 0.7 K/uL   Basophils Relative 0 %   Basophils Absolute 0.0 0.0 - 0.1 K/uL  Basic metabolic panel     Status: Abnormal   Collection Time: 10/25/17  3:53 AM  Result Value Ref Range   Sodium 151 (H) 135 - 145 mmol/L   Potassium 4.1 3.5 - 5.1  mmol/L   Chloride 125 (H) 101 - 111 mmol/L   CO2 21 (L) 22 - 32 mmol/L   Glucose, Bld 172 (H) 65 - 99 mg/dL   BUN 14 6 - 20 mg/dL   Creatinine, Ser 1.61 0.61 - 1.24 mg/dL   Calcium 8.0 (L) 8.9 - 10.3 mg/dL   GFR calc non Af Amer >60 >60 mL/min   GFR calc Af Amer >60 >60 mL/min   Anion gap 5 5 - 15    Assessment & Plan: Present on Admission: **None**    LOS: 3 days   Additional comments:I reviewed the patient's new clinical lab test results. and CT head PHBC TBI/R basal ganglia hemorrhage/IVH - 3% saline and F/U head CT in AM per Dr. Lovell Sheehan L segmental tibia FX - OR tomorrow by Dr. Jena Gauss Mult facial lacs, nasal FX - S/P repair and CR with packing by Dr. Jenne Pane Grade 2 spleen lac ABL anemia - multifactorial, follow Hb Acute hypoxic vent dependent resp failure - wean but will not extubate FEN - hypernatremia trending up on 3%, add free water, TF, albumin bolus VTE - mechanical with expansion of BG hematoma on last CT Dispo - ICU  Critical Care Total Time*: 45 Minutes  Violeta Gelinas, MD, MPH, FACS Trauma: [phone] General Surgery: [phone]  10/25/2017  *Care during the described time interval was provided by me. I have reviewed this patient's available data, including medical history, events of note, physical examination and test results as part of my evaluation.  Patient ID: Derril Franek, male   DOB: 1971-04-18, 47 y.o.    MRN: [id number]
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
## 2017-10-26 ENCOUNTER — Inpatient Hospital Stay (HOSPITAL_COMMUNITY): Payer: Medicare HMO

## 2017-10-26 ENCOUNTER — Inpatient Hospital Stay (HOSPITAL_COMMUNITY): Payer: Medicare HMO | Admitting: Certified Registered"

## 2017-10-26 ENCOUNTER — Encounter (HOSPITAL_COMMUNITY): Payer: Self-pay | Admitting: Anesthesiology

## 2017-10-26 ENCOUNTER — Encounter (HOSPITAL_COMMUNITY): Admission: EM | Disposition: E | Payer: Self-pay | Source: Home / Self Care

## 2017-10-26 HISTORY — PX: TIBIA IM NAIL INSERTION: SHX2516

## 2017-10-26 LAB — BASIC METABOLIC PANEL
ANION GAP: 7 (ref 5–15)
BUN: 17 mg/dL (ref 6–20)
CHLORIDE: 129 mmol/L — AB (ref 101–111)
CO2: 20 mmol/L — ABNORMAL LOW (ref 22–32)
Calcium: 8.2 mg/dL — ABNORMAL LOW (ref 8.9–10.3)
Creatinine, Ser: 1.27 mg/dL — ABNORMAL HIGH (ref 0.61–1.24)
GFR calc Af Amer: >60 mL/min (ref >60)
Glucose, Bld: 141 mg/dL — ABNORMAL HIGH (ref 65–99)
POTASSIUM: 3.7 mmol/L (ref 3.5–5.1)
Sodium: 156 mmol/L — ABNORMAL HIGH (ref 135–145)

## 2017-10-26 LAB — SODIUM
SODIUM: 161 mmol/L — AB (ref 135–145)
Sodium: 159 mmol/L — ABNORMAL HIGH (ref 135–145)
Sodium: 158 mmol/L — ABNORMAL HIGH (ref 135–145)

## 2017-10-26 LAB — CBC
HEMATOCRIT: 27.2 % — AB (ref 39.0–52.0)
HEMOGLOBIN: 8.8 g/dL — AB (ref 13.0–17.0)
MCH: 32.2 pg (ref 26.0–34.0)
MCHC: 32.4 g/dL (ref 30.0–36.0)
MCV: 99.6 fL (ref 78.0–100.0)
PLATELETS: 146 10*3/uL — AB (ref 150–400)
RBC: 2.73 MIL/uL — AB (ref 4.22–5.81)
RDW: 17.1 % — ABNORMAL HIGH (ref 11.5–15.5)
WBC: 11.2 10*3/uL — AB (ref 4.0–10.5)

## 2017-10-26 LAB — PREPARE RBC (CROSSMATCH)

## 2017-10-26 LAB — TRIGLYCERIDES: Triglycerides: 129 mg/dL (ref <150)

## 2017-10-26 SURGERY — INSERTION, INTRAMEDULLARY ROD, TIBIA
Anesthesia: General | Site: Leg Lower | Laterality: Left

## 2017-10-26 MED ORDER — ONDANSETRON HCL 4 MG/2ML IJ SOLN
INTRAMUSCULAR | Status: DC | PRN
  Administered 2017-10-26: 4 mg via INTRAVENOUS

## 2017-10-26 MED ORDER — DEXAMETHASONE SODIUM PHOSPHATE 10 MG/ML IJ SOLN
INTRAMUSCULAR | Status: AC
  Filled 2017-10-26: qty 1

## 2017-10-26 MED ORDER — BACITRACIN ZINC 500 UNIT/GM EX OINT
TOPICAL_OINTMENT | CUTANEOUS | Status: AC
  Filled 2017-10-26: qty 28.35

## 2017-10-26 MED ORDER — CEFAZOLIN SODIUM-DEXTROSE 2-4 GM/100ML-% IV SOLN
2.0000 g | Freq: Three times a day (TID) | INTRAVENOUS | Status: AC
  Administered 2017-10-26 – 2017-10-27 (×3): 2 g via INTRAVENOUS
  Filled 2017-10-26 (×3): qty 100

## 2017-10-26 MED ORDER — SODIUM CHLORIDE 0.9 % IV SOLN: Freq: Once | INTRAVENOUS | Status: AC

## 2017-10-26 MED ORDER — CEFAZOLIN SODIUM 1 G IJ SOLR
INTRAMUSCULAR | Status: AC
  Filled 2017-10-26: qty 20

## 2017-10-26 MED ORDER — FENTANYL CITRATE (PF) 250 MCG/5ML IJ SOLN
INTRAMUSCULAR | Status: AC
  Filled 2017-10-26: qty 5

## 2017-10-26 MED ORDER — DEXTROSE 5 % IV SOLN
3.0000 g | Freq: Once | INTRAVENOUS | Status: AC
  Administered 2017-10-26: 3 g via INTRAVENOUS
  Filled 2017-10-26: qty 3000

## 2017-10-26 MED ORDER — ROCURONIUM BROMIDE 100 MG/10ML IV SOLN
INTRAVENOUS | Status: DC | PRN
  Administered 2017-10-26 (×2): 50 mg via INTRAVENOUS

## 2017-10-26 MED ORDER — ROCURONIUM BROMIDE 10 MG/ML (PF) SYRINGE
PREFILLED_SYRINGE | INTRAVENOUS | Status: AC
  Filled 2017-10-26: qty 10

## 2017-10-26 MED ORDER — MIDAZOLAM HCL 5 MG/5ML IJ SOLN
INTRAMUSCULAR | Status: DC | PRN
  Administered 2017-10-26: 2 mg via INTRAVENOUS

## 2017-10-26 MED ORDER — VANCOMYCIN HCL 1000 MG IV SOLR
INTRAVENOUS | Status: DC | PRN
  Administered 2017-10-26: 1000 mg

## 2017-10-26 MED ORDER — ONDANSETRON HCL 4 MG/2ML IJ SOLN
INTRAMUSCULAR | Status: AC
  Filled 2017-10-26: qty 2

## 2017-10-26 MED ORDER — SODIUM CHLORIDE 0.9% FLUSH: 10.0000 mL | INTRAVENOUS | Status: DC | PRN

## 2017-10-26 MED ORDER — FENTANYL CITRATE (PF) 100 MCG/2ML IJ SOLN
50.0000 ug | INTRAMUSCULAR | Status: DC | PRN
  Administered 2017-10-26 – 2017-11-05 (×17): 100 ug via INTRAVENOUS
  Filled 2017-10-26 (×17): qty 2

## 2017-10-26 MED ORDER — FENTANYL CITRATE (PF) 250 MCG/5ML IJ SOLN
INTRAMUSCULAR | Status: AC
Start: 2017-10-26 — End: ?
  Filled 2017-10-26: qty 5

## 2017-10-26 MED ORDER — FENTANYL CITRATE (PF) 100 MCG/2ML IJ SOLN
INTRAMUSCULAR | Status: DC | PRN
  Administered 2017-10-26 (×2): 50 ug via INTRAVENOUS
  Administered 2017-10-26 (×2): 100 ug via INTRAVENOUS
  Administered 2017-10-26: 50 ug via INTRAVENOUS

## 2017-10-26 MED ORDER — PROPOFOL 10 MG/ML IV BOLUS
INTRAVENOUS | Status: DC | PRN
  Administered 2017-10-26 (×2): 50 mg via INTRAVENOUS

## 2017-10-26 MED ORDER — SODIUM CHLORIDE 0.9% FLUSH
10.0000 mL | Freq: Two times a day (BID) | INTRAVENOUS | Status: DC
  Administered 2017-10-27 – 2017-11-01 (×11): 10 mL
  Administered 2017-11-02: 20 mL
  Administered 2017-11-02: 10 mL
  Administered 2017-11-05: 20 mL
  Administered 2017-11-05 – 2017-11-06 (×3): 10 mL

## 2017-10-26 MED ORDER — SODIUM CHLORIDE 0.9 % IV SOLN
INTRAVENOUS | Status: DC | PRN
  Administered 2017-10-26 (×2): via INTRAVENOUS

## 2017-10-26 MED ORDER — BACITRACIN ZINC 500 UNIT/GM EX OINT
TOPICAL_OINTMENT | CUTANEOUS | Status: DC | PRN
  Administered 2017-10-26: 1 via TOPICAL

## 2017-10-26 MED ORDER — PROPOFOL 10 MG/ML IV BOLUS
INTRAVENOUS | Status: AC
  Filled 2017-10-26: qty 20

## 2017-10-26 MED ORDER — LIDOCAINE 2% (20 MG/ML) 5 ML SYRINGE
INTRAMUSCULAR | Status: AC
  Filled 2017-10-26: qty 5

## 2017-10-26 MED ORDER — LABETALOL HCL 5 MG/ML IV SOLN
INTRAVENOUS | Status: DC | PRN
  Administered 2017-10-26: 10 mg via INTRAVENOUS

## 2017-10-26 MED ORDER — MIDAZOLAM HCL 2 MG/2ML IJ SOLN
INTRAMUSCULAR | Status: AC
  Filled 2017-10-26: qty 2

## 2017-10-26 MED ORDER — 0.9 % SODIUM CHLORIDE (POUR BTL) OPTIME
TOPICAL | Status: DC | PRN
Start: 2017-10-26 — End: 2017-10-26
  Administered 2017-10-26: 1000 mL

## 2017-10-26 MED ORDER — CEFAZOLIN SODIUM 1 G IJ SOLR
INTRAMUSCULAR | Status: AC
  Filled 2017-10-26: qty 10

## 2017-10-26 MED ORDER — VANCOMYCIN HCL 1000 MG IV SOLR
INTRAVENOUS | Status: AC
  Filled 2017-10-26: qty 1000

## 2017-10-26 MED ORDER — CHLORHEXIDINE GLUCONATE CLOTH 2 % EX PADS: 6.0000 | MEDICATED_PAD | Freq: Every day | CUTANEOUS | Status: DC

## 2017-10-26 SURGICAL SUPPLY — 52 items
BANDAGE ACE 4X5 VEL STRL LF (GAUZE/BANDAGES/DRESSINGS) ×2 IMPLANT
BANDAGE ACE 6X5 VEL STRL LF (GAUZE/BANDAGES/DRESSINGS) ×2 IMPLANT
BIT DRILL 4.2 (DRILL) ×1 IMPLANT
BIT DRILL CALIBRATED 4.2 (BIT) ×1 IMPLANT
BIT DRILL SHORT 4.2 (BIT) ×1 IMPLANT
BLADE SURG 10 STRL SS (BLADE) ×2 IMPLANT
BNDG COHESIVE 4X5 TAN STRL (GAUZE/BANDAGES/DRESSINGS) ×2 IMPLANT
BNDG GAUZE ELAST 4 BULKY (GAUZE/BANDAGES/DRESSINGS) ×4 IMPLANT
BRUSH SCRUB SURG 4.25 DISP (MISCELLANEOUS) ×2 IMPLANT
CHLORAPREP W/TINT 26ML (MISCELLANEOUS) ×2 IMPLANT
COVER SURGICAL LIGHT HANDLE (MISCELLANEOUS) ×2 IMPLANT
DRAPE C-ARM 42X72 X-RAY (DRAPES) ×2 IMPLANT
DRAPE C-ARMOR (DRAPES) ×2 IMPLANT
DRAPE HALF SHEET 40X57 (DRAPES) ×2 IMPLANT
DRAPE INCISE IOBAN 66X45 STRL (DRAPES) ×2 IMPLANT
DRAPE ORTHO SPLIT 77X108 STRL (DRAPES) ×4
DRAPE SURG ORHT 6 SPLT 77X108 (DRAPES) ×2 IMPLANT
DRAPE U-SHAPE 47X51 STRL (DRAPES) ×2 IMPLANT
DRILL 4.2 (DRILL) ×2
DRILL BIT CALIBRATED 4.2 (BIT) ×2
DRILL BIT SHORT 4.2 (BIT) ×2
DRSG ADAPTIC 3X8 NADH LF (GAUZE/BANDAGES/DRESSINGS) ×2 IMPLANT
ELECT CAUTERY BLADE 6.4 (BLADE) ×2 IMPLANT
ELECT REM PT RETURN 9FT ADLT (ELECTROSURGICAL) ×2
ELECTRODE REM PT RTRN 9FT ADLT (ELECTROSURGICAL) ×1 IMPLANT
GAUZE SPONGE 4X4 12PLY STRL (GAUZE/BANDAGES/DRESSINGS) ×2 IMPLANT
GLOVE BIO SURGEON STRL SZ7.5 (GLOVE) ×8 IMPLANT
GLOVE BIOGEL PI IND STRL 7.5 (GLOVE) ×1 IMPLANT
GLOVE BIOGEL PI INDICATOR 7.5 (GLOVE) ×1
GOWN STRL REUS W/ TWL LRG LVL3 (GOWN DISPOSABLE) ×2 IMPLANT
GOWN STRL REUS W/TWL LRG LVL3 (GOWN DISPOSABLE) ×4
KIT BASIN OR (CUSTOM PROCEDURE TRAY) ×2 IMPLANT
KIT ROOM TURNOVER OR (KITS) ×2 IMPLANT
NAIL TIB 11X390 (Nail) ×2 IMPLANT
PACK TOTAL JOINT (CUSTOM PROCEDURE TRAY) ×2 IMPLANT
PAD ARMBOARD 7.5X6 YLW CONV (MISCELLANEOUS) ×2 IMPLANT
PIN STMN TROCAR PT 9X.094 (PIN) ×4 IMPLANT
REAMER ROD DEEP FLUTE 2.5X950 (INSTRUMENTS) ×2 IMPLANT
SCREW LOCK STAR 5X36 (Screw) ×4 IMPLANT
SCREW LOCK STAR 5X38 (Screw) ×2 IMPLANT
SCREW LOCK STAR 5X40 (Screw) ×2 IMPLANT
SCREW LOCK STAR 5X42 (Screw) ×2 IMPLANT
STAPLER VISISTAT 35W (STAPLE) ×2 IMPLANT
SUT ETHILON 3 0 FSL (SUTURE) ×2 IMPLANT
SUT MNCRL AB 3-0 PS2 18 (SUTURE) ×2 IMPLANT
SUT VIC AB 0 CT1 27 (SUTURE) ×2
SUT VIC AB 0 CT1 27XBRD ANBCTR (SUTURE) ×1 IMPLANT
SUT VIC AB 2-0 CT1 27 (SUTURE) ×2
SUT VIC AB 2-0 CT1 TAPERPNT 27 (SUTURE) ×1 IMPLANT
TOWEL OR 17X24 6PK STRL BLUE (TOWEL DISPOSABLE) ×2 IMPLANT
TOWEL OR 17X26 10 PK STRL BLUE (TOWEL DISPOSABLE) ×2 IMPLANT
YANKAUER SUCT BULB TIP NO VENT (SUCTIONS) IMPLANT

## 2017-10-26 NOTE — Anesthesia Preprocedure Evaluation (Signed)
Anesthesia Evaluation  Patient identified by MRN, date of birth, ID band Patient awake and Patient unresponsive  General Assessment Comment:Pt arrived emergently from ER already intubated.  Reviewed: Allergy & Precautions, NPO status , [name] Chart, lab work & pertinent test results, Unable to perform ROS - Chart review onlyPreop documentation limited or incomplete due to emergent nature of procedure.  Airway Mallampati: Intubated   Neck ROM: limited   Comment: Intubated. In C-collar Dental   Pulmonary neg pulmonary ROS,  Intubated.  Sats 88% upon arrival    + decreased breath sounds      Cardiovascular negative cardio ROS   Rhythm:regular Rate:Tachycardia  Tachycardic, acidotic   Neuro/Psych negative neurological ROS  negative psych ROS   GI/Hepatic negative GI ROS, Neg liver ROS,   Endo/Other  negative endocrine ROS  Renal/GU negative Renal ROS  negative genitourinary   Musculoskeletal negative musculoskeletal ROS (+)   Abdominal   Peds negative pediatric ROS (+)  Hematology negative hematology ROS (+)   Anesthesia Other Findings   Reproductive/Obstetrics negative OB ROS                             Anesthesia Physical  Anesthesia Plan  ASA: III and emergent  Anesthesia Plan: General   Post-op Pain Management:    Induction: Intravenous  PONV Risk Score and Plan: 2 and Ondansetron, Treatment may vary due to age or medical condition and Midazolam  Airway Management Planned: Oral ETT  Additional Equipment: Arterial line and CVP  Intra-op Plan:   Post-operative Plan: Post-operative intubation/ventilation  Informed Consent: I have reviewed the patients History and Physical, chart, labs and discussed the procedure including the risks, benefits and alternatives for the proposed anesthesia with the patient or authorized representative who has indicated his/her understanding and  acceptance.     Plan Discussed with: CRNA, Anesthesiologist and Surgeon  Anesthesia Plan Comments:         Anesthesia Quick Evaluation
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
## 2017-10-26 NOTE — Transfer of Care (Signed)
Immediate Anesthesia Transfer of Care Note  Patient: [name]  Procedure(s) Performed: INTRAMEDULLARY (IM) NAIL TIBIAL (Left Leg Lower)  Patient Location: ICU  Anesthesia Type:General  Level of Consciousness: unresponsive and Patient remains intubated per anesthesia plan  Airway & Oxygen Therapy: Patient remains intubated per anesthesia plan, Patient placed on Ventilator (see vital sign flow sheet for setting) and report given to receiving RN  Post-op Assessment: Report given to RN and Post -op Vital signs reviewed and stable  Post vital signs: Reviewed  Last Vitals:  Vitals:   10/07/2017 0700 10/12/2017 0800  BP: (!) 141/76 (!) 142/72  Pulse: 60 62  Resp: 18 18  Temp: 37.7 C 37.7 C  SpO2: 100% 100%    Last Pain:  Vitals:   09/30/2017 0400  TempSrc: Core (Comment)         Complications: No apparent anesthesia complications
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
## 2017-10-26 NOTE — Progress Notes (Signed)
Assessed at 0820.

## 2017-10-26 NOTE — Progress Notes (Signed)
   Subjective:    Patient ID: [name], male    DOB: 03/03/1971, 47 y.o.   MRN: [id number]  HPI Remains intubated, extending to pain only.  Review of Systems     Objective:   Physical Exam Intubated on ventilator. Nasal splint in place.  Removed packs, no bleeding. Forehead and lip wounds intact with sutures in place.    Assessment & Plan:  Facial wounds, nasal fracture, epistaxis  Removed packs from nose.  No active bleeding.  Will plan splint and suture removal Monday.

## 2017-10-26 NOTE — Progress Notes (Signed)
Patient ID: [name], male   DOB: 03/05/1971, 47 y.o.   MRN: [id number] BP (!) 165/98   Pulse 72   Temp (!) 100.4 F (38 C)   Resp 17   Ht 6' (1.829 m)   Wt 117.4 kg (258 lb 13.1 oz)   SpO2 100%   BMI 35.10 kg/m  Comatose, no change Do not believe this is survivable Will speak with trauma

## 2017-10-26 NOTE — Anesthesia Postprocedure Evaluation (Signed)
Anesthesia Post Note  Patient: Jeremiah RosenthalSky Mathews  Procedure(s) Performed: INTRAMEDULLARY (IM) NAIL TIBIAL (Left Leg Lower)     Patient location during evaluation: ICU Anesthesia Type: General Level of consciousness: patient remains intubated per anesthesia plan Pain management: pain level controlled Vital Signs Assessment: post-procedure vital signs reviewed and stable Respiratory status: patient on ventilator - see flowsheet for VS Cardiovascular status: blood pressure returned to baseline and stable Postop Assessment: no apparent nausea or vomiting Anesthetic complications: no    Last Vitals:  Vitals:   10/19/2017 0800 10/22/2017 1117  BP: (!) 142/72 (!) 161/78  Pulse: 62 79  Resp: 18 (!) 23  Temp: 37.7 C   SpO2: 100% 98%    Last Pain:  Vitals:   10/11/2017 0400  TempSrc: Core (Comment)                 Lowella CurbWarren Ray Daphne Karrer
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
## 2017-10-26 NOTE — Interval H&P Note (Signed)
History and Physical Interval Note:  10/09/2017 8:09 AM  Jeremiah RosenthalSky Mathews  has presented today for surgery, with the diagnosis of Left tibia fracture  The various methods of treatment have been discussed with the patient and family. After consideration of risks, benefits and other options for treatment, the patient has consented to  Procedure(s): INTRAMEDULLARY (IM) NAIL TIBIAL (Left) as a surgical intervention .  The patient's history has been reviewed, patient examined, no change in status, stable for surgery.  I have reviewed the patient's chart and labs.  Questions were answered to the patient's satisfaction.     Ariyan Brisendine, Gillie MannersKevin P

## 2017-10-26 NOTE — Anesthesia Procedure Notes (Signed)
Date/Time: 10/12/2017 8:30 AM Performed by: Lovie Cholock, Derold Dorsch K, CRNA Pre-anesthesia Checklist: Patient identified, Emergency Drugs available, Suction available and Patient being monitored Patient Re-evaluated:Patient Re-evaluated prior to induction Oxygen Delivery Method: Circle system utilized Preoxygenation: Pre-oxygenation with 100% oxygen Induction Type: Inhalational induction with existing ETT Placement Confirmation: breath sounds checked- equal and bilateral and positive ETCO2 Secured at: 25 cm

## 2017-10-27 ENCOUNTER — Encounter (HOSPITAL_COMMUNITY): Payer: Self-pay | Admitting: Student

## 2017-10-27 LAB — SODIUM
SODIUM: 159 mmol/L — AB (ref 135–145)
Sodium: 160 mmol/L — ABNORMAL HIGH (ref 135–145)
Sodium: 161 mmol/L (ref 135–145)

## 2017-10-27 LAB — CBC WITH DIFFERENTIAL/PLATELET
BASOS ABS: 0 10*3/uL (ref 0.0–0.1)
Basophils Relative: 0 %
EOS PCT: 5 %
Eosinophils Absolute: 0.4 10*3/uL (ref 0.0–0.7)
HCT: 26.9 % — ABNORMAL LOW (ref 39.0–52.0)
HEMOGLOBIN: 8.2 g/dL — AB (ref 13.0–17.0)
LYMPHS ABS: 1.8 10*3/uL (ref 0.7–4.0)
LYMPHS PCT: 24 %
MCH: 30.7 pg (ref 26.0–34.0)
MCHC: 30.5 g/dL (ref 30.0–36.0)
MCV: 100.7 fL — AB (ref 78.0–100.0)
Monocytes Absolute: 0.9 10*3/uL (ref 0.1–1.0)
Monocytes Relative: 11 %
Neutro Abs: 4.6 10*3/uL (ref 1.7–7.7)
Neutrophils Relative %: 60 %
Platelets: 120 10*3/uL — ABNORMAL LOW (ref 150–400)
RBC: 2.67 MIL/uL — AB (ref 4.22–5.81)
RDW: 17.3 % — AB (ref 11.5–15.5)
WBC: 7.7 10*3/uL (ref 4.0–10.5)

## 2017-10-27 LAB — BASIC METABOLIC PANEL
BUN: 20 mg/dL (ref 6–20)
CALCIUM: 7.9 mg/dL — AB (ref 8.9–10.3)
CO2: 20 mmol/L — ABNORMAL LOW (ref 22–32)
Chloride: >130 mmol/L (ref 101–111)
Creatinine, Ser: 1.33 mg/dL — ABNORMAL HIGH (ref 0.61–1.24)
Glucose, Bld: 134 mg/dL — ABNORMAL HIGH (ref 65–99)
Potassium: 3.2 mmol/L — ABNORMAL LOW (ref 3.5–5.1)
Sodium: 160 mmol/L — ABNORMAL HIGH (ref 135–145)

## 2017-10-27 MED ORDER — SODIUM CHLORIDE 0.9 % IV SOLN
INTRAVENOUS | Status: DC
  Administered 2017-10-27 – 2017-10-28 (×2): via INTRAVENOUS

## 2017-10-27 MED ORDER — ACETAMINOPHEN 160 MG/5ML PO SOLN
650.0000 mg | Freq: Four times a day (QID) | ORAL | Status: DC | PRN
  Administered 2017-10-27 – 2017-11-02 (×7): 650 mg via ORAL
  Filled 2017-10-27 (×6): qty 20.3

## 2017-10-27 MED ORDER — CHLORHEXIDINE GLUCONATE 0.12 % MT SOLN
OROMUCOSAL | Status: AC
  Filled 2017-10-27: qty 15

## 2017-10-27 MED ORDER — ACETAMINOPHEN 160 MG/5ML PO SOLN
325.0000 mg | Freq: Four times a day (QID) | ORAL | Status: DC | PRN
  Filled 2017-10-27: qty 20.3

## 2017-10-27 MED ORDER — PRO-STAT SUGAR FREE PO LIQD
60.0000 mL | Freq: Four times a day (QID) | ORAL | Status: DC
  Administered 2017-10-27 – 2017-11-02 (×27): 60 mL
  Filled 2017-10-27 (×26): qty 60

## 2017-10-27 MED ORDER — PIVOT 1.5 CAL PO LIQD
1000.0000 mL | ORAL | Status: DC
  Administered 2017-10-27 – 2017-11-02 (×6): 1000 mL

## 2017-10-27 MED ORDER — POTASSIUM CHLORIDE 20 MEQ/15ML (10%) PO SOLN
40.0000 meq | Freq: Once | ORAL | Status: AC
  Administered 2017-10-27: 40 meq via ORAL
  Filled 2017-10-27: qty 30
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
## 2017-10-27 NOTE — Progress Notes (Signed)
Patient ID: Jeremiah RosenthalSky Mathews, male   DOB: 11/08/1970, 47 y.o.   MRN: [id number] BP (!) 155/76   Pulse 64   Temp (!) 100.7 F (38.2 C) (Axillary)   Resp (!) 24   Ht 6' (1.829 m)   Wt 117.4 kg (258 lb 13.1 oz)   SpO2 99%   BMI 35.10 kg/m  Comatose, +corneals, +cough Moves extremities to noxious stimuli No change in exam No new recs
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
## 2017-10-27 NOTE — Progress Notes (Signed)
Pt placed on SBT 5/5.  RR increased to 30's.  PS increased to 10 and pt tolerating well at this time.  RT will continue to monitor.

## 2017-10-27 NOTE — Progress Notes (Signed)
Patient ID: [name], male   DOB: 12-21-1970, 47 y.o.   MRN: [id number] I met with [name]'s long time partner who is making decisions on his behalf at the bedside and updated him. I advised him of Choya's low lever of neurologic function and the poor prognosis stated by neurosurgery. He wants [place] to continue full support at this time. I let him know we will need to make decisions early next week about possible trach/PEG. I also discussed likely eventual SNF placement. He said he wants to think about it for now.  Georganna Skeans, MD, MPH, FACS Trauma: [phone] General Surgery: [phone]

## 2017-10-28 ENCOUNTER — Inpatient Hospital Stay (HOSPITAL_COMMUNITY): Payer: Medicare HMO

## 2017-10-28 DIAGNOSIS — S062XAA Diffuse traumatic brain injury with loss of consciousness status unknown, initial encounter: Secondary | ICD-10-CM

## 2017-10-28 DIAGNOSIS — S0181XA Laceration without foreign body of other part of head, initial encounter: Secondary | ICD-10-CM

## 2017-10-28 DIAGNOSIS — S0990XA Unspecified injury of head, initial encounter: Secondary | ICD-10-CM

## 2017-10-28 DIAGNOSIS — S82262A Displaced segmental fracture of shaft of left tibia, initial encounter for closed fracture: Secondary | ICD-10-CM

## 2017-10-28 DIAGNOSIS — S82832A Other fracture of upper and lower end of left fibula, initial encounter for closed fracture: Secondary | ICD-10-CM

## 2017-10-28 DIAGNOSIS — S062X9A Diffuse traumatic brain injury with loss of consciousness of unspecified duration, initial encounter: Secondary | ICD-10-CM

## 2017-10-28 LAB — BASIC METABOLIC PANEL
BUN: 29 mg/dL — AB (ref 6–20)
CO2: 20 mmol/L — ABNORMAL LOW (ref 22–32)
Calcium: 8 mg/dL — ABNORMAL LOW (ref 8.9–10.3)
Chloride: >130 mmol/L (ref 101–111)
Creatinine, Ser: 1.36 mg/dL — ABNORMAL HIGH (ref 0.61–1.24)
GFR calc Af Amer: >60 mL/min (ref >60)
GLUCOSE: 164 mg/dL — AB (ref 65–99)
POTASSIUM: 3.3 mmol/L — AB (ref 3.5–5.1)
Sodium: 160 mmol/L — ABNORMAL HIGH (ref 135–145)

## 2017-10-28 LAB — CBC
HEMATOCRIT: 28.8 % — AB (ref 39.0–52.0)
Hemoglobin: 8.8 g/dL — ABNORMAL LOW (ref 13.0–17.0)
MCH: 30.9 pg (ref 26.0–34.0)
MCHC: 30.6 g/dL (ref 30.0–36.0)
MCV: 101.1 fL — AB (ref 78.0–100.0)
Platelets: 145 10*3/uL — ABNORMAL LOW (ref 150–400)
RBC: 2.85 MIL/uL — ABNORMAL LOW (ref 4.22–5.81)
RDW: 17.3 % — AB (ref 11.5–15.5)
WBC: 9.1 10*3/uL (ref 4.0–10.5)

## 2017-10-28 LAB — POCT I-STAT 3, ART BLOOD GAS (G3+)
ACID-BASE DEFICIT: 3 mmol/L — AB (ref 0.0–2.0)
Acid-base deficit: 6 mmol/L — ABNORMAL HIGH (ref 0.0–2.0)
BICARBONATE: 22 mmol/L (ref 20.0–28.0)
Bicarbonate: 21.4 mmol/L (ref 20.0–28.0)
O2 SAT: 99 %
O2 SAT: 100 %
PCO2 ART: 58.9 mmHg — AB (ref 32.0–48.0)
Patient temperature: 101.1
TCO2: 23 mmol/L (ref 22–32)
TCO2: 23 mmol/L (ref 22–32)
pCO2 arterial: 35.7 mmHg (ref 32.0–48.0)
pH, Arterial: 7.176 — CL (ref 7.350–7.450)
pH, Arterial: 7.398 (ref 7.350–7.450)
pO2, Arterial: 157 mmHg — ABNORMAL HIGH (ref 83.0–108.0)
pO2, Arterial: 325 mmHg — ABNORMAL HIGH (ref 83.0–108.0)

## 2017-10-28 LAB — SODIUM
Sodium: 158 mmol/L — ABNORMAL HIGH (ref 135–145)
Sodium: 158 mmol/L — ABNORMAL HIGH (ref 135–145)

## 2017-10-28 LAB — TRIGLYCERIDES: Triglycerides: 135 mg/dL (ref <150)

## 2017-10-28 MED ORDER — MIDAZOLAM HCL 2 MG/2ML IJ SOLN: 4.0000 mg | Freq: Once | INTRAMUSCULAR | Status: DC

## 2017-10-28 MED ORDER — SODIUM CHLORIDE 0.9 % IV SOLN
2.0000 g | Freq: Two times a day (BID) | INTRAVENOUS | Status: DC
  Administered 2017-10-28 – 2017-11-02 (×11): 2 g via INTRAVENOUS
  Filled 2017-10-28 (×11): qty 2

## 2017-10-28 MED ORDER — VANCOMYCIN HCL 10 G IV SOLR
2000.0000 mg | Freq: Once | INTRAVENOUS | Status: AC
  Administered 2017-10-28: 2000 mg via INTRAVENOUS
  Filled 2017-10-28: qty 2000

## 2017-10-28 MED ORDER — MIDAZOLAM HCL 2 MG/2ML IJ SOLN
2.0000 mg | INTRAMUSCULAR | Status: DC | PRN
  Administered 2017-11-01 – 2017-11-03 (×2): 2 mg via INTRAVENOUS
  Filled 2017-10-28 (×7): qty 2

## 2017-10-28 MED ORDER — POTASSIUM CHLORIDE 20 MEQ/15ML (10%) PO SOLN
40.0000 meq | Freq: Once | ORAL | Status: AC
  Administered 2017-10-28: 40 meq via ORAL
  Filled 2017-10-28: qty 30

## 2017-10-28 MED ORDER — HYDRALAZINE HCL 20 MG/ML IJ SOLN
10.0000 mg | INTRAMUSCULAR | Status: DC | PRN
  Administered 2017-10-28 – 2017-11-05 (×10): 10 mg via INTRAVENOUS
  Filled 2017-10-28 (×11): qty 1

## 2017-10-28 MED ORDER — PROPOFOL 1000 MG/100ML IV EMUL
INTRAVENOUS | Status: AC
  Administered 2017-10-28: 20 ug/kg/min via INTRAVENOUS
  Filled 2017-10-28: qty 100

## 2017-10-28 MED ORDER — PROPOFOL 1000 MG/100ML IV EMUL
5.0000 ug/kg/min | INTRAVENOUS | Status: DC
  Administered 2017-10-28 – 2017-10-29 (×7): 20 ug/kg/min via INTRAVENOUS
  Administered 2017-10-30 (×2): 15 ug/kg/min via INTRAVENOUS
  Administered 2017-10-30: 20 ug/kg/min via INTRAVENOUS
  Administered 2017-10-31: 30 ug/kg/min via INTRAVENOUS
  Administered 2017-10-31 – 2017-11-01 (×2): 15 ug/kg/min via INTRAVENOUS
  Administered 2017-11-01 – 2017-11-03 (×5): 20 ug/kg/min via INTRAVENOUS
  Administered 2017-11-03: 30 ug/kg/min via INTRAVENOUS
  Administered 2017-11-03: 40 ug/kg/min via INTRAVENOUS
  Administered 2017-11-03: 15 ug/kg/min via INTRAVENOUS
  Administered 2017-11-03: 30 ug/kg/min via INTRAVENOUS
  Administered 2017-11-04: 10 ug/kg/min via INTRAVENOUS
  Filled 2017-10-28 (×19): qty 100

## 2017-10-28 MED ORDER — PANTOPRAZOLE SODIUM 40 MG PO PACK
40.0000 mg | PACK | Freq: Every day | ORAL | Status: DC
  Administered 2017-10-28 – 2017-11-05 (×8): 40 mg
  Filled 2017-10-28 (×8): qty 20

## 2017-10-28 MED ORDER — VANCOMYCIN HCL IN DEXTROSE 750-5 MG/150ML-% IV SOLN
750.0000 mg | Freq: Two times a day (BID) | INTRAVENOUS | Status: DC
  Administered 2017-10-28 – 2017-10-31 (×7): 750 mg via INTRAVENOUS
  Filled 2017-10-28 (×8): qty 150

## 2017-10-28 MED ORDER — VECURONIUM BROMIDE 10 MG IV SOLR
10.0000 mg | Freq: Once | INTRAVENOUS | Status: AC
  Administered 2017-10-28: 10 mg via INTRAVENOUS

## 2017-10-28 MED ORDER — VANCOMYCIN HCL IN DEXTROSE 2-5 GM/500ML-% IV SOLN
2000.0000 mg | Freq: Once | INTRAVENOUS | Status: DC
  Filled 2017-10-28: qty 500

## 2017-10-28 MED ORDER — FREE WATER
200.0000 mL | Freq: Four times a day (QID) | Status: DC
  Administered 2017-10-28 – 2017-11-06 (×31): 200 mL

## 2017-10-28 MED ORDER — MIDAZOLAM HCL 2 MG/2ML IJ SOLN
INTRAMUSCULAR | Status: AC
  Administered 2017-10-28: 4 mg
  Filled 2017-10-28: qty 4

## 2017-10-28 MED ORDER — ALBUMIN HUMAN 5 % IV SOLN
12.5000 g | Freq: Once | INTRAVENOUS | Status: AC
  Administered 2017-10-28: 12.5 g via INTRAVENOUS
  Filled 2017-10-28: qty 250

## 2017-10-28 MED ORDER — HYDRALAZINE HCL 20 MG/ML IJ SOLN
10.0000 mg | Freq: Once | INTRAMUSCULAR | Status: AC
  Administered 2017-10-28: 10 mg via INTRAVENOUS

## 2017-10-28 MED ORDER — SODIUM CHLORIDE 0.45 % IV SOLN
INTRAVENOUS | Status: DC
  Administered 2017-10-28 – 2017-11-05 (×9): via INTRAVENOUS
  Filled 2017-10-28 (×16): qty 1000

## 2017-10-28 MED ORDER — SODIUM CHLORIDE 0.9 % IV SOLN
2.0000 g | INTRAVENOUS | Status: DC
  Filled 2017-10-28: qty 2

## 2017-10-28 NOTE — Progress Notes (Signed)
Patient ID: Myles RosenthalSky Mathews, male   DOB: 12/18/1970, 47 y.o.   MRN: [id number] Desat and was biting on ETT preventing ventilation. Bite block changed. Resume propofol. Repeat abg at 12. Albumin bolus.  Jeremiah GelinasBurke Caria Transue, MD, MPH, FACS Trauma: [phone] General Surgery: [phone]

## 2017-10-28 NOTE — Progress Notes (Signed)
Patient ID: [name], male   DOB: 01/30/1971, 47 y.o.   MRN: [id number] BP 140/77   Pulse (!) 58   Temp (!) 97 F (36.1 C)   Resp 20   Ht 6' (1.829 m)   Wt 117.4 kg (258 lb 13.1 oz)   SpO2 100%   BMI 35.10 kg/m  No change clinically No improvement Comatose, corneals, cough stable

## 2017-10-28 NOTE — Progress Notes (Signed)
CRITICAL VALUE ALERT  Critical Value:  CHLORIDE >130  Date & Time Notied:  10/28/2017 0600  Provider Notified: Gaynelle AduERIC WILSON, MD  Orders Received/Actions taken: Continue to monitor patient; MD will address during rounds

## 2017-10-28 NOTE — Progress Notes (Signed)
Patient ID: Myles RosenthalSky Strubel, male   DOB: 12/13/1970, 47 y.o.   MRN: [id number] I updated his partner at the bedside.  Violeta GelinasBurke Eugena Rhue, MD, MPH, FACS Trauma: [phone] General Surgery: [phone]

## 2017-10-28 NOTE — Progress Notes (Addendum)
RT called to the pts bedside for desat. RT at the pts bedside found pt biting ETT. RN at the bedside. Bite block placed. RT able to pass sxn catheter. RT placed pt on 100% FIO2. ABG was done. MD at the pts bedside. Sats became stable 98% on 100% FIO2. Will repeat abg.
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
## 2017-10-28 NOTE — Progress Notes (Signed)
MD notified of patient's heart rate in 40s. RN will continue to monitor.

## 2017-10-28 NOTE — Progress Notes (Signed)
ETT secure. Breakdown noted on lips. RN aware. RT placed pt on SBT 5/5, 30. Pt tol well. Sputum obtained and sent to lab.
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
## 2017-10-28 DEATH — deceased

## 2017-10-29 LAB — URINE CULTURE: Culture: NO GROWTH

## 2017-10-29 LAB — BASIC METABOLIC PANEL
ANION GAP: 8 (ref 5–15)
BUN: 33 mg/dL — ABNORMAL HIGH (ref 6–20)
CALCIUM: 7.9 mg/dL — AB (ref 8.9–10.3)
CO2: 23 mmol/L (ref 22–32)
Chloride: 124 mmol/L — ABNORMAL HIGH (ref 101–111)
Creatinine, Ser: 1.14 mg/dL (ref 0.61–1.24)
GFR calc Af Amer: >60 mL/min (ref >60)
GLUCOSE: 141 mg/dL — AB (ref 65–99)
POTASSIUM: 3.3 mmol/L — AB (ref 3.5–5.1)
Sodium: 155 mmol/L — ABNORMAL HIGH (ref 135–145)

## 2017-10-29 LAB — CBC
HCT: 26.9 % — ABNORMAL LOW (ref 39.0–52.0)
Hemoglobin: 8.2 g/dL — ABNORMAL LOW (ref 13.0–17.0)
MCH: 31.3 pg (ref 26.0–34.0)
MCHC: 30.5 g/dL (ref 30.0–36.0)
MCV: 102.7 fL — AB (ref 78.0–100.0)
PLATELETS: 130 10*3/uL — AB (ref 150–400)
RBC: 2.62 MIL/uL — AB (ref 4.22–5.81)
RDW: 17.7 % — ABNORMAL HIGH (ref 11.5–15.5)
WBC: 9.5 10*3/uL (ref 4.0–10.5)

## 2017-10-29 LAB — SODIUM
SODIUM: 156 mmol/L — AB (ref 135–145)
Sodium: 154 mmol/L — ABNORMAL HIGH (ref 135–145)
Sodium: 152 mmol/L — ABNORMAL HIGH (ref 135–145)
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
## 2017-10-29 NOTE — Progress Notes (Signed)
Subjective: Patient reports comatose  Objective: Vital signs in last 24 hours: Temp:  [96.4 F (35.8 C)-101.7 F (38.7 C)] 99.9 F (37.7 C) (03/02 1100) Pulse Rate:  [56-79] 60 (03/02 1207) Resp:  [11-28] 21 (03/02 1207) BP: (139-162)/(70-89) 149/77 (03/02 1207) SpO2:  [100 %] 100 % (03/02 1207) FiO2 (%):  [40 %-50 %] 40 % (03/02 1207)  Intake/Output from previous day: 03/01 0701 - 03/02 0700 In: 4250.6 [I.V.:2055.6; NG/GT:1345; IV Piggyback:850] Out: 3000 [Urine:3000] Intake/Output this shift: Total I/O In: 322.9 [I.V.:247.9; NG/GT:75] Out: 160 [Urine:160]  Physical Exam: Exam is stable.  Patient is breathing spontaneously.    Lab Results: Recent Labs    10/28/17 0447 10/29/17 0415  WBC 9.1 9.5  HGB 8.8* 8.2*  HCT 28.8* 26.9*  PLT 145* 130*   BMET Recent Labs    10/28/17 0447  10/29/17 0415 10/29/17 1002  NA 160*   < > 155* 156*  K 3.3*  --  3.3*  --   CL >130*  --  124*  --   CO2 20*  --  23  --   GLUCOSE 164*  --  141*  --   BUN 29*  --  33*  --   CREATININE 1.36*  --  1.14  --   CALCIUM 8.0*  --  7.9*  --    < > = values in this interval not displayed.    Studies/Results: Dg Chest Port 1 View  Result Date: 10/28/2017 CLINICAL DATA:  Respiratory failure. EXAM: PORTABLE CHEST 1 VIEW COMPARISON:  Chest x-ray 10/22/2017. FINDINGS: Endotracheal tube and NG tube in stable position. Stable cardiomegaly. Bibasilar atelectasis/infiltrates again noted. No interim change. No pleural effusion or pneumothorax. IMPRESSION: 1.  Lines and tubes stable position. 2. Bibasilar atelectasis/infiltrates again noted. No interim change. Electronically Signed   By: Maisie Fushomas  Register   On: 10/28/2017 07:34    Assessment/Plan: Continue current management.  No new recommendations.    LOS: 7 days    Dorian HeckleSTERN,Toryn Mcclinton D, MD 10/29/2017, 12:16 PM

## 2017-10-30 LAB — TYPE AND SCREEN
ABO/RH(D): A POS
ANTIBODY SCREEN: NEGATIVE
UNIT DIVISION: 0
Unit division: 0

## 2017-10-30 LAB — CBC
HCT: 27.7 % — ABNORMAL LOW (ref 39.0–52.0)
Hemoglobin: 8.5 g/dL — ABNORMAL LOW (ref 13.0–17.0)
MCH: 31.6 pg (ref 26.0–34.0)
MCHC: 30.7 g/dL (ref 30.0–36.0)
MCV: 103 fL — ABNORMAL HIGH (ref 78.0–100.0)
PLATELETS: 134 10*3/uL — AB (ref 150–400)
RBC: 2.69 MIL/uL — AB (ref 4.22–5.81)
RDW: 17 % — AB (ref 11.5–15.5)
WBC: 8.4 10*3/uL (ref 4.0–10.5)

## 2017-10-30 LAB — CULTURE, RESPIRATORY

## 2017-10-30 LAB — BASIC METABOLIC PANEL
Anion gap: 8 (ref 5–15)
BUN: 34 mg/dL — AB (ref 6–20)
CALCIUM: 7.6 mg/dL — AB (ref 8.9–10.3)
CO2: 23 mmol/L (ref 22–32)
Chloride: 117 mmol/L — ABNORMAL HIGH (ref 101–111)
Creatinine, Ser: 1.11 mg/dL (ref 0.61–1.24)
GFR calc Af Amer: >60 mL/min (ref >60)
Glucose, Bld: 139 mg/dL — ABNORMAL HIGH (ref 65–99)
POTASSIUM: 3.1 mmol/L — AB (ref 3.5–5.1)
SODIUM: 148 mmol/L — AB (ref 135–145)

## 2017-10-30 LAB — BPAM RBC
BLOOD PRODUCT EXPIRATION DATE: 201903182359
BLOOD PRODUCT EXPIRATION DATE: 201903182359
Unit Type and Rh: 6200
Unit Type and Rh: 6200

## 2017-10-30 LAB — CULTURE, RESPIRATORY W GRAM STAIN: Culture: NORMAL

## 2017-10-30 LAB — MAGNESIUM: MAGNESIUM: 2.3 mg/dL (ref 1.7–2.4)

## 2017-10-30 MED ORDER — POTASSIUM CHLORIDE 20 MEQ/15ML (10%) PO SOLN
40.0000 meq | Freq: Two times a day (BID) | ORAL | Status: AC
  Administered 2017-10-30 (×2): 40 meq via ORAL
  Filled 2017-10-30 (×2): qty 30

## 2017-10-30 MED ORDER — CHLORHEXIDINE GLUCONATE CLOTH 2 % EX PADS
6.0000 | MEDICATED_PAD | Freq: Every day | CUTANEOUS | Status: DC
  Administered 2017-10-30 – 2017-11-03 (×3): 6 via TOPICAL

## 2017-10-30 NOTE — Progress Notes (Signed)
Subjective: Patient reports no response  Objective: Vital signs in last 24 hours: Temp:  [99.7 F (37.6 C)-102.2 F (39 C)] 101.5 F (38.6 C) (03/03 0700) Pulse Rate:  [48-73] 51 (03/03 0857) Resp:  [19-36] 26 (03/03 0857) BP: (148-165)/(74-84) 165/75 (03/03 0857) SpO2:  [100 %] 100 % (03/03 0857) FiO2 (%):  [40 %] 40 % (03/03 0857)  Intake/Output from previous day: 03/02 0701 - 03/03 0700 In: 3323 [I.V.:2123; NG/GT:1000; IV Piggyback:200] Out: 2660 [Urine:2660] Intake/Output this shift: No intake/output data recorded.  Physical Exam: Pupils 2 mm, reactive, patient extensor postures to painful stimuli.  On vent.  Otherwise unresponsive.  Lab Results: Recent Labs    10/29/17 0415 10/30/17 0402  WBC 9.5 8.4  HGB 8.2* 8.5*  HCT 26.9* 27.7*  PLT 130* 134*   BMET Recent Labs    10/29/17 0415  10/29/17 2153 10/30/17 0402  NA 155*   < > 152* 148*  K 3.3*  --   --  3.1*  CL 124*  --   --  117*  CO2 23  --   --  23  GLUCOSE 141*  --   --  139*  BUN 33*  --   --  34*  CREATININE 1.14  --   --  1.11  CALCIUM 7.9*  --   --  7.6*   < > = values in this interval not displayed.    Studies/Results: No results found.  Assessment/Plan: No change in clinical exam.  Continue supportive care.  Prognosis remains poor.    LOS: 8 days    Dorian HeckleSTERN,Maudry Zeidan D, MD 10/30/2017, 10:38 AM
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
## 2017-10-31 ENCOUNTER — Inpatient Hospital Stay (HOSPITAL_COMMUNITY): Payer: Medicare HMO

## 2017-10-31 LAB — BLOOD GAS, ARTERIAL
Acid-Base Excess: 1.1 mmol/L (ref 0.0–2.0)
BICARBONATE: 24.2 mmol/L (ref 20.0–28.0)
Drawn by: 414221
FIO2: 40
LHR: 18 {breaths}/min
O2 Saturation: 99 %
PATIENT TEMPERATURE: 99.2
PEEP/CPAP: 5 cmH2O
VT: 620 mL
pCO2 arterial: 32.4 mmHg (ref 32.0–48.0)
pH, Arterial: 7.487 — ABNORMAL HIGH (ref 7.350–7.450)
pO2, Arterial: 119 mmHg — ABNORMAL HIGH (ref 83.0–108.0)

## 2017-10-31 LAB — BASIC METABOLIC PANEL
ANION GAP: 8 (ref 5–15)
BUN: 29 mg/dL — ABNORMAL HIGH (ref 6–20)
CALCIUM: 8 mg/dL — AB (ref 8.9–10.3)
CHLORIDE: 115 mmol/L — AB (ref 101–111)
CO2: 23 mmol/L (ref 22–32)
CREATININE: 0.81 mg/dL (ref 0.61–1.24)
GFR calc non Af Amer: >60 mL/min (ref >60)
Glucose, Bld: 146 mg/dL — ABNORMAL HIGH (ref 65–99)
Potassium: 3.2 mmol/L — ABNORMAL LOW (ref 3.5–5.1)
SODIUM: 146 mmol/L — AB (ref 135–145)

## 2017-10-31 LAB — CBC
HEMATOCRIT: 28.6 % — AB (ref 39.0–52.0)
HEMOGLOBIN: 9.1 g/dL — AB (ref 13.0–17.0)
MCH: 31.8 pg (ref 26.0–34.0)
MCHC: 31.8 g/dL (ref 30.0–36.0)
MCV: 100 fL (ref 78.0–100.0)
Platelets: 145 10*3/uL — ABNORMAL LOW (ref 150–400)
RBC: 2.86 MIL/uL — ABNORMAL LOW (ref 4.22–5.81)
RDW: 16.5 % — ABNORMAL HIGH (ref 11.5–15.5)
WBC: 7 10*3/uL (ref 4.0–10.5)

## 2017-10-31 LAB — TRIGLYCERIDES: Triglycerides: 176 mg/dL — ABNORMAL HIGH (ref <150)

## 2017-10-31 MED ORDER — POTASSIUM CHLORIDE 20 MEQ/15ML (10%) PO SOLN
40.0000 meq | Freq: Two times a day (BID) | ORAL | Status: DC
  Administered 2017-10-31 – 2017-11-02 (×5): 40 meq via ORAL
  Filled 2017-10-31 (×5): qty 30

## 2017-10-31 MED ORDER — POTASSIUM CHLORIDE 10 MEQ/50ML IV SOLN
10.0000 meq | INTRAVENOUS | Status: AC
  Administered 2017-10-31 (×3): 10 meq via INTRAVENOUS
  Filled 2017-10-31 (×4): qty 50

## 2017-10-31 NOTE — Progress Notes (Signed)
Follow up - Trauma and Critical Care  Patient Details:    [name] is an 47 y.o. male.  Lines/tubes : Airway 7.5 mm (Active)  Secured at (cm) 25 cm 10/31/2017  8:23 AM  Measured From Lips 10/31/2017  8:23 AM  Secured Location Left 10/31/2017  8:23 AM  Secured By [employer] 10/31/2017  8:23 AM  Tube Holder Repositioned Yes 10/31/2017  8:23 AM  Cuff Pressure (cm H2O) 22 cm H2O 10/31/2017  8:23 AM  Site Condition Dry 10/31/2017  8:23 AM     CVC Double Lumen 10/23/17 Left Subclavian 16 cm (Active)  Indication for Insertion or Continuance of Line Administration of hyperosmolar/irritating solutions (i.e. TPN, Vancomycin, etc.) 10/30/2017  8:00 PM  Site Assessment Clean;Dry;Intact 10/30/2017  8:00 PM  Proximal Lumen Status Infusing 10/30/2017  8:00 PM  Distal Lumen Status In-line blood sampling system in place 10/30/2017  8:00 PM  Dressing Type Transparent 10/30/2017 11:00 PM  Dressing Status Clean;Dry;Antimicrobial disc in place 10/30/2017 11:00 PM  Line Care Proximal tubing changed 10/30/2017 11:00 PM  Dressing Intervention New dressing;Antimicrobial disc changed;Dressing changed 10/30/2017 11:00 PM  Dressing Change Due 11/12/2017 10/30/2017 11:00 PM     NG/OG Tube Orogastric 16 Fr. Right mouth Aucultation (Active)  Site Assessment Clean;Dry;Intact 10/30/2017  8:00 PM  Ongoing Placement Verification No acute changes, not attributed to clinical condition;No change in respiratory status;No change in cm markings or external length of tube from initial placement;Xray 10/30/2017  8:00 PM  Status Infusing tube feed 10/30/2017  8:00 PM  Drainage Appearance Bile 10/24/2017  8:00 AM  Output (mL) 300 mL 10/24/2017  5:00 AM     Urethral Catheter Jessica Ferrainolo Temperature probe 16 Fr. (Active)  Indication for Insertion or Continuance of Catheter Other (comment) 10/31/2017  8:00 AM  Site Assessment Clean;Intact 10/30/2017  8:00 PM  Catheter Maintenance Bag below level of bladder;Insertion date on drainage bag;Catheter  secured;Seal intact;Drainage bag/tubing not touching floor;No dependent loops 10/31/2017  8:00 AM  Collection Container Standard drainage bag 10/30/2017  8:00 PM  Securement Method Securing device (Describe) 10/30/2017  8:00 PM  Urinary Catheter Interventions Unclamped 10/30/2017  8:00 PM  Output (mL) 100 mL 10/31/2017  6:55 AM    Microbiology/Sepsis markers: Results for orders placed or performed during the hospital encounter of 10/21/2017  Culture, respiratory (NON-Expectorated)     Status: None   Collection Time: 10/28/17  8:12 AM  Result Value Ref Range Status   Specimen Description TRACHEAL ASPIRATE  Final   Special Requests NONE  Final   Gram Stain   Final    MODERATE WBC PRESENT, PREDOMINANTLY PMN NO SQUAMOUS EPITHELIAL CELLS SEEN RARE GRAM POSITIVE COCCI IN PAIRS    Culture   Final    Consistent with normal respiratory flora. Performed at [facility], [address], [place], [place] [address]    Report Status 10/30/2017 FINAL  Final  Culture, Urine     Status: None   Collection Time: 10/28/17  8:12 AM  Result Value Ref Range Status   Specimen Description URINE, CATHETERIZED  Final   Special Requests NONE  Final   Culture   Final    NO GROWTH Performed at [facility], [address], [place], [place] [address]    Report Status 10/29/2017 FINAL  Final  Culture, blood (Routine X 2) w Reflex to ID Panel     Status: None (Preliminary result)   Collection Time: 10/28/17 10:52 AM  Result Value Ref Range Status  Specimen Description BLOOD LEFT HAND  Final   Special Requests   Final    BOTTLES DRAWN AEROBIC AND ANAEROBIC Blood Culture adequate volume   Culture   Final    NO GROWTH 2 DAYS Performed at [facility], [address] [place], [place] [address]    Report Status PENDING  Incomplete  Culture, blood (Routine X 2) w Reflex to ID Panel     Status: None (Preliminary result)   Collection Time: 10/28/17 11:08 AM  Result Value Ref Range Status   Specimen  Description BLOOD LEFT HAND  Final   Special Requests IN PEDIATRIC BOTTLE Blood Culture adequate volume  Final   Culture   Final    NO GROWTH 2 DAYS Performed at [facility], [address], [place], [place] [address]    Report Status PENDING  Incomplete    Anti-infectives:  Anti-infectives (From admission, onward)   Start     Dose/Rate Route Frequency Ordered Stop   10/28/17 2200  vancomycin (VANCOCIN) IVPB 750 mg/150 ml premix     750 mg 150 mL/hr over 60 Minutes Intravenous Every 12 hours 10/28/17 0812     10/28/17 0900  ceFEPIme (MAXIPIME) 2 g in sodium chloride 0.9 % 100 mL IVPB     2 g 200 mL/hr over 30 Minutes Intravenous Every 12 hours 10/28/17 0813     10/28/17 0900  vancomycin (VANCOCIN) 2,000 mg in sodium chloride 0.9 % 500 mL IVPB     2,000 mg 250 mL/hr over 120 Minutes Intravenous  Once 10/28/17 0825 10/28/17 1233   10/28/17 0815  Vancomycin HCl in Dextrose 2-5 GM/500ML-% SOLN 2,000 mg  Status:  Discontinued     2,000 mg Intravenous  Once 10/28/17 0812 10/28/17 0824   10/28/17 0800  ceFEPIme (MAXIPIME) 2 g in sodium chloride 0.9 % 100 mL IVPB  Status:  Discontinued     2 g 200 mL/hr over 30 Minutes Intravenous Every 24 hours 10/28/17 0755 10/28/17 0813   10/05/2017 1700  ceFAZolin (ANCEF) IVPB 2g/100 mL premix     2 g 200 mL/hr over 30 Minutes Intravenous Every 8 hours 10/27/2017 1127 10/27/17 0634   10/24/2017 1016  vancomycin (VANCOCIN) powder  Status:  Discontinued       As needed 10/25/2017 1016 10/16/2017 1029   10/14/2017 0900  ceFAZolin (ANCEF) 3 g in dextrose 5 % 50 mL IVPB     3 g 130 mL/hr over 30 Minutes Intravenous  Once 10/05/2017 0856 10/01/2017 0850   10/24/17 1100  ceFAZolin (ANCEF) IVPB 2g/100 mL premix     2 g 200 mL/hr over 30 Minutes Intravenous On call to O.R. 10/24/17 1007 10/25/17 0559      Best Practice/Protocols:  VTE Prophylaxis: Lovenox (prophylaxtic dose) and Mechanical GI Prophylaxis: Proton Pump Inhibitor Continous Sedation Propofol  only.  Consults: Treatment Team:  Roby Lofts, MD Tressie Stalker, MD Coletta Memos, MD    Events:  Subjective:    Overnight Issues: No changes overnight.  Objective:  Vital signs for last 24 hours: Temp:  [98.2 F (36.8 C)-100.8 F (38.2 C)] 99.7 F (37.6 C) (03/04 0900) Pulse Rate:  [51-81] 71 (03/04 0900) Resp:  [18-25] 24 (03/04 0900) BP: (148-170)/(64-86) 148/64 (03/04 0900) SpO2:  [98 %-100 %] 98 % (03/04 0900) FiO2 (%):  [40 %] 40 % (03/04 0823)  Hemodynamic parameters for last 24 hours:    Intake/Output from previous day: 03/03 0701 - 03/04 0700 In: 3658.9 [I.V.:1983.9; NG/GT:775; IV  Piggyback:900] Out: 3685 [Urine:3685]  Intake/Output this shift: No intake/output data recorded.  Vent settings for last 24 hours: Vent Mode: PRVC FiO2 (%):  [40 %] 40 % Set Rate:  [18 bmp] 18 bmp Vt Set:  [620 mL] 620 mL PEEP:  [5 cmH20] 5 cmH20 Pressure Support:  [8 cmH20] 8 cmH20 Plateau Pressure:  [14 cmH20-20 cmH20] 20 cmH20  Physical Exam:  General: no respiratory distress Neuro: RASS -3 or deeper Resp: clear to auscultation bilaterally CVS: regular rate and rhythm, S1, S2 normal, no murmur, click, rub or gallop GI: Soft, tolerating tube feedings at goal Extremities: edema 1+  Results for orders placed or performed during the hospital encounter of 10/03/2017 (from the past 24 hour(s))  CBC     Status: Abnormal   Collection Time: 10/31/17  3:07 AM  Result Value Ref Range   WBC 7.0 4.0 - 10.5 K/uL   RBC 2.86 (L) 4.22 - 5.81 MIL/uL   Hemoglobin 9.1 (L) 13.0 - 17.0 g/dL   HCT 11.928.6 (L) 14.739.0 - 82.952.0 %   MCV 100.0 78.0 - 100.0 fL   MCH 31.8 26.0 - 34.0 pg   MCHC 31.8 30.0 - 36.0 g/dL   RDW 56.216.5 (H) 13.011.5 - 86.515.5 %   Platelets 145 (L) 150 - 400 K/uL  Basic metabolic panel     Status: Abnormal   Collection Time: 10/31/17  3:07 AM  Result Value Ref Range   Sodium 146 (H) 135 - 145 mmol/L   Potassium 3.2 (L) 3.5 - 5.1 mmol/L   Chloride 115 (H) 101 - 111 mmol/L    CO2 23 22 - 32 mmol/L   Glucose, Bld 146 (H) 65 - 99 mg/dL   BUN 29 (H) 6 - 20 mg/dL   Creatinine, Ser 7.840.81 0.61 - 1.24 mg/dL   Calcium 8.0 (L) 8.9 - 10.3 mg/dL   GFR calc non Af Amer >60 >60 mL/min   GFR calc Af Amer >60 >60 mL/min   Anion gap 8 5 - 15  Blood gas, arterial     Status: Abnormal   Collection Time: 10/31/17  5:15 AM  Result Value Ref Range   FIO2 40.00    Delivery systems VENTILATOR    Mode PRESSURE REGULATED VOLUME CONTROL    VT 620 mL   LHR 18 resp/min   Peep/cpap 5.0 cm H20   pH, Arterial 7.487 (H) 7.350 - 7.450   pCO2 arterial 32.4 32.0 - 48.0 mmHg   pO2, Arterial 119 (H) 83.0 - 108.0 mmHg   Bicarbonate 24.2 20.0 - 28.0 mmol/L   Acid-Base Excess 1.1 0.0 - 2.0 mmol/L   O2 Saturation 99.0 %   Patient temperature 99.2    Collection site RIGHT RADIAL    Drawn by [phone]    Sample type ARTERIAL DRAW    Allens test (pass/fail) PASS PASS     Assessment/Plan:   NEURO  Altered Mental Status:  obtundation   Plan: Only postures with stimulation  PULM  Hyperventilating a bit, oxygenating well.   Plan: No changes in management  CARDIO  No signficant issues   Plan: CPM  RENAL  Hypokalemia moderate (2.8 - 3.5 meq/dl)   Plan: Replacing KCl  GI  No issues   Plan: Continue tube feedings.  ID  No identified infectious source   Plan: CPM  HEME  Anemia anemia of critical illness)   Plan: No needed for daily labs or transfusion currently  ENDO No specific issues   Plan: CPM  Global Issues  Need to discuss long term plans with the patient's partner.    LOS: 9 days   Additional comments:I reviewed the patient's new clinical lab test results. cbc/bmet/abg  Critical Care Total Time*: 30 Minutes  Jimmye Norman 10/31/2017  *Care during the described time interval was provided by me and/or other providers on the critical care team.  I have reviewed this patient's available data, including medical history, events of note, physical examination and test results as  part of my evaluation.

## 2017-10-31 NOTE — Progress Notes (Signed)
Patient ID: Jeremiah RosenthalSky Mathews, male   DOB: 10/14/1970, 47 y.o.   MRN: [id number] Subjective: The patient is comatose and in no apparent distress.  Objective: Vital signs in last 24 hours: Temp:  [98.2 F (36.8 C)-100.6 F (38.1 C)] 99.1 F (37.3 C) (03/04 1600) Pulse Rate:  [51-81] 59 (03/04 1600) Resp:  [20-25] 20 (03/04 1600) BP: (148-182)/(64-88) 181/88 (03/04 1600) SpO2:  [98 %-100 %] 100 % (03/04 1652) FiO2 (%):  [40 %] 40 % (03/04 1652) Estimated body mass index is 35.1 kg/m as calculated from the following:   Height as of this encounter: 6' (1.829 m).   Weight as of this encounter: 117.4 kg (258 lb 13.1 oz).   Intake/Output from previous day: 03/03 0701 - 03/04 0700 In: 3758.9 [I.V.:2058.9; NG/GT:800; IV Piggyback:900] Out: 3685 [Urine:3685] Intake/Output this shift: Total I/O In: 1245.3 [I.V.:570.3; NG/GT:575; IV Piggyback:100] Out: 900 [Urine:200; Emesis/NG output:700]  Physical exam Glasgow Coma Scale 4 intubated, he decerebrate postures bilaterally.  His pupils are equal.  Lab Results: Recent Labs    10/30/17 0402 10/31/17 0307  WBC 8.4 7.0  HGB 8.5* 9.1*  HCT 27.7* 28.6*  PLT 134* 145*   BMET Recent Labs    10/30/17 0402 10/31/17 0307  NA 148* 146*  K 3.1* 3.2*  CL 117* 115*  CO2 23 23  GLUCOSE 139* 146*  BUN 34* 29*  CREATININE 1.11 0.81  CALCIUM 7.6* 8.0*    Studies/Results: Dg Chest Port 1 View  Result Date: 10/31/2017 CLINICAL DATA:  Respiratory failure, intubated patient. EXAM: PORTABLE CHEST 1 VIEW COMPARISON:  Portable chest x-ray of October 28, 2017 FINDINGS: The lungs are adequately inflated. The interstitial markings are increased. There is patchy increased density at both bases. There is no large pleural effusion. The heart is normal in size. The pulmonary vascularity is mildly prominent but stable. The endotracheal tube tip projects 3.7 cm above the carina. The esophagogastric tube tip and proximal port project below the GE junction. The right  subclavian correction left subclavian venous catheter tip projects over the extreme medial aspect of the left subclavian vein. IMPRESSION: Persistent bibasilar atelectasis. Normal cardiac silhouette size. Mild central pulmonary vascular prominence. The support tubes are in stable position. Electronically Signed   By: David  SwazilandJordan M.D.   On: 10/31/2017 07:21    Assessment/Plan: Right basal ganglia hemorrhage: The patient is without change neurologically.  His prognosis is poor.  LOS: 9 days     Cristi LoronJeffrey D Martasia Talamante 10/31/2017, 5:15 PM

## 2017-10-31 NOTE — Progress Notes (Signed)
   Subjective:    Patient ID: [name], male    DOB: 08/28/1971, 47 y.o.   MRN: [id number]  HPI Remains obtunded on ventilator  Review of Systems     Objective:   Physical Exam Intubated Facial wounds intact, crusted Nasal splint in place     Assessment & Plan:  Facial wounds, nasal fracture  Removed facial sutures and nasal splint.  Will sign off.

## 2017-10-31 NOTE — Progress Notes (Signed)
Discussed with MD about not being able to meet BP goals of less than 160 with PRN.  MD aware, no new orders at this time.

## 2017-11-01 LAB — BASIC METABOLIC PANEL
Anion gap: 9 (ref 5–15)
BUN: 28 mg/dL — ABNORMAL HIGH (ref 6–20)
CHLORIDE: 110 mmol/L (ref 101–111)
CO2: 21 mmol/L — ABNORMAL LOW (ref 22–32)
CREATININE: 0.8 mg/dL (ref 0.61–1.24)
Calcium: 8 mg/dL — ABNORMAL LOW (ref 8.9–10.3)
GFR calc non Af Amer: >60 mL/min (ref >60)
Glucose, Bld: 135 mg/dL — ABNORMAL HIGH (ref 65–99)
POTASSIUM: 3.4 mmol/L — AB (ref 3.5–5.1)
SODIUM: 140 mmol/L (ref 135–145)

## 2017-11-01 LAB — VANCOMYCIN, TROUGH: Vancomycin Tr: 10 ug/mL — ABNORMAL LOW (ref 15–20)

## 2017-11-01 MED ORDER — ENOXAPARIN SODIUM 40 MG/0.4ML ~~LOC~~ SOLN
40.0000 mg | SUBCUTANEOUS | Status: DC
  Administered 2017-11-01 – 2017-11-06 (×6): 40 mg via SUBCUTANEOUS
  Filled 2017-11-01 (×6): qty 0.4

## 2017-11-01 MED ORDER — VANCOMYCIN HCL IN DEXTROSE 1-5 GM/200ML-% IV SOLN
1000.0000 mg | Freq: Two times a day (BID) | INTRAVENOUS | Status: DC
  Administered 2017-11-01 (×2): 1000 mg via INTRAVENOUS
  Filled 2017-11-01 (×3): qty 200
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
## 2017-11-01 NOTE — Progress Notes (Signed)
Patient ID: Carlson Belland, male   DOB: [birth year], 47 y.o.   MRN: [id number] I met with his partner, Kevan Ny, and updated him. We discussed goals of care and he wants [place] to proceed with trach/PEG later this week. Dr. Hulen Skains will likely do that Thursday. Kevan Ny understands Taevyn will most likely require SNF placement. He will very likely come off the vent quickly so this will allow local placement.  Georganna Skeans, MD, MPH, FACS Trauma: [phone] General Surgery: [phone]
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
## 2017-11-01 NOTE — Progress Notes (Signed)
Subjective: Patient is comatose and in no apparent distress.  Objective: Vital signs in last 24 hours: Temp:  [98.8 F (37.1 C)-100.8 F (38.2 C)] 99 F (37.2 C) (03/05 0700) Pulse Rate:  [57-80] 63 (03/05 0700) Resp:  [20-29] 25 (03/05 0700) BP: (136-182)/(64-89) 169/78 (03/05 0745) SpO2:  [97 %-100 %] 98 % (03/05 0745) FiO2 (%):  [40 %] 40 % (03/05 0752) Estimated body mass index is 35.1 kg/m as calculated from the following:   Height as of this encounter: 6' (1.829 m).   Weight as of this encounter: 117.4 kg (258 lb 13.1 oz).   Intake/Output from previous day: 03/04 0701 - 03/05 0700 In: 3855.3 [I.V.:1855.3; NG/GT:1400; IV Piggyback:600] Out: 2235 [Urine:1535; Emesis/NG output:700] Intake/Output this shift: No intake/output data recorded.  Physical exam is good coma scale 4 intubated.  He decerebrate postures to pain bilaterally.  His pupils are approximately 3 mm and reactive bilaterally.  Lab Results: Recent Labs    10/30/17 0402 10/31/17 0307  WBC 8.4 7.0  HGB 8.5* 9.1*  HCT 27.7* 28.6*  PLT 134* 145*   BMET Recent Labs    10/30/17 0402 10/31/17 0307  NA 148* 146*  K 3.1* 3.2*  CL 117* 115*  CO2 23 23  GLUCOSE 139* 146*  BUN 34* 29*  CREATININE 1.11 0.81  CALCIUM 7.6* 8.0*    Studies/Results: Dg Chest Port 1 View  Result Date: 10/31/2017 CLINICAL DATA:  Respiratory failure, intubated patient. EXAM: PORTABLE CHEST 1 VIEW COMPARISON:  Portable chest x-ray of October 28, 2017 FINDINGS: The lungs are adequately inflated. The interstitial markings are increased. There is patchy increased density at both bases. There is no large pleural effusion. The heart is normal in size. The pulmonary vascularity is mildly prominent but stable. The endotracheal tube tip projects 3.7 cm above the carina. The esophagogastric tube tip and proximal port project below the GE junction. The right subclavian correction left subclavian venous catheter tip projects over the extreme  medial aspect of the left subclavian vein. IMPRESSION: Persistent bibasilar atelectasis. Normal cardiac silhouette size. Mild central pulmonary vascular prominence. The support tubes are in stable position. Electronically Signed   By: David  SwazilandJordan M.D.   On: 10/31/2017 07:21    Assessment/Plan: Right basal ganglia hemorrhage: The patient's neurologic status has not a changed.  His prognosis for meaningful recovery is poor.  I will be out of town the next few days.  If any neurosurgical issues arise, please contact Dr. Franky Machoabbell or the on-call doctor.  LOS: 10 days     Cristi LoronJeffrey D Rohaan Durnil 11/01/2017, 8:08 AM

## 2017-11-01 NOTE — Progress Notes (Signed)
Pt increased respirations, HR, and BP. RT notified and pt put back on full support. Pt continues to have increase respirations. Suctioned pt. Notified RT.

## 2017-11-01 NOTE — Progress Notes (Signed)
Patient ID: Jeremiah RosenthalSky Mathews, male   DOB: 03/13/1971, 47 y.o.   MRN: [id number] I D/W Dr. Lovell SheehanJenkins - OK for Lovenox  Jeremiah GelinasBurke Abimael Zeiter, MD, MPH, FACS Trauma: [phone] General Surgery: [phone]

## 2017-11-02 ENCOUNTER — Inpatient Hospital Stay (HOSPITAL_COMMUNITY): Payer: Medicare HMO

## 2017-11-02 LAB — BASIC METABOLIC PANEL
ANION GAP: 8 (ref 5–15)
BUN: 37 mg/dL — ABNORMAL HIGH (ref 6–20)
CALCIUM: 7.9 mg/dL — AB (ref 8.9–10.3)
CHLORIDE: 110 mmol/L (ref 101–111)
CO2: 22 mmol/L (ref 22–32)
Creatinine, Ser: 0.83 mg/dL (ref 0.61–1.24)
GFR calc non Af Amer: >60 mL/min (ref >60)
Glucose, Bld: 121 mg/dL — ABNORMAL HIGH (ref 65–99)
Potassium: 3.7 mmol/L (ref 3.5–5.1)
Sodium: 140 mmol/L (ref 135–145)

## 2017-11-02 LAB — CULTURE, BLOOD (ROUTINE X 2)
CULTURE: NO GROWTH
Culture: NO GROWTH
Special Requests: ADEQUATE
Special Requests: ADEQUATE

## 2017-11-02 LAB — CBC
HEMATOCRIT: 27 % — AB (ref 39.0–52.0)
HEMOGLOBIN: 8.4 g/dL — AB (ref 13.0–17.0)
MCH: 31.2 pg (ref 26.0–34.0)
MCHC: 31.1 g/dL (ref 30.0–36.0)
MCV: 100.4 fL — ABNORMAL HIGH (ref 78.0–100.0)
Platelets: 216 10*3/uL (ref 150–400)
RBC: 2.69 MIL/uL — AB (ref 4.22–5.81)
RDW: 17.9 % — ABNORMAL HIGH (ref 11.5–15.5)
WBC: 13.6 10*3/uL — AB (ref 4.0–10.5)

## 2017-11-02 NOTE — Progress Notes (Signed)
Follow up - Trauma and Critical Care  Patient Details:    [name] is an 47 y.o. male.  Lines/tubes : Airway 7.5 mm (Active)  Secured at (cm) 25 cm 11/02/2017  7:40 AM  Measured From Lips 11/02/2017  7:40 AM  Secured Location Center 11/02/2017  7:40 AM  Secured By [employer] 11/02/2017  7:40 AM  Tube Holder Repositioned Yes 11/02/2017  7:40 AM  Cuff Pressure (cm H2O) 22 cm H2O 11/02/2017  7:40 AM  Site Condition Dry 11/02/2017  7:40 AM     CVC Double Lumen 10/23/17 Left Subclavian 16 cm (Active)  Indication for Insertion or Continuance of Line Administration of hyperosmolar/irritating solutions (i.e. TPN, Vancomycin, etc.) 11/02/2017  8:00 AM  Site Assessment Clean;Dry;Intact 11/02/2017  8:00 AM  Proximal Lumen Status Infusing 11/02/2017  8:00 AM  Distal Lumen Status In-line blood sampling system in place 11/02/2017  8:00 AM  Dressing Type Transparent;Occlusive 11/02/2017  8:00 AM  Dressing Status Clean;Dry;Intact;Antimicrobial disc in place 11/02/2017  8:00 AM  Line Care Connections checked and tightened 11/02/2017  8:00 AM  Dressing Intervention Other (Comment) 10/31/2017  8:00 AM  Dressing Change Due 11/02/2017 11/02/2017  8:00 AM     NG/OG Tube Orogastric 16 Fr. Right mouth Aucultation (Active)  Site Assessment Clean;Dry;Intact 11/02/2017  8:00 AM  Ongoing Placement Verification No acute changes, not attributed to clinical condition;No change in respiratory status;Xray 11/02/2017  8:00 AM  Status Infusing tube feed 11/02/2017  8:00 AM  Drainage Appearance Bile 10/31/2017  8:00 AM  Output (mL) 150 mL 10/31/2017 12:00 PM     Urethral Catheter Jessica Ferrainolo Temperature probe 16 Fr. (Active)  Indication for Insertion or Continuance of Catheter Other (comment) 11/02/2017  8:00 AM  Site Assessment Clean;Intact 11/02/2017  8:00 AM  Catheter Maintenance Bag below level of bladder;Catheter secured;Drainage bag/tubing not touching floor;Insertion date on drainage bag;No dependent loops;Seal intact 11/02/2017  8:00  AM  Collection Container Standard drainage bag 11/02/2017  8:00 AM  Securement Method Securing device (Describe) 11/02/2017  8:00 AM  Urinary Catheter Interventions Unclamped 11/02/2017  8:00 AM  Output (mL) 120 mL 11/02/2017  8:00 AM    Microbiology/Sepsis markers: Results for orders placed or performed during the hospital encounter of 10/14/2017  Culture, respiratory (NON-Expectorated)     Status: None   Collection Time: 10/28/17  8:12 AM  Result Value Ref Range Status   Specimen Description TRACHEAL ASPIRATE  Final   Special Requests NONE  Final   Gram Stain   Final    MODERATE WBC PRESENT, PREDOMINANTLY PMN NO SQUAMOUS EPITHELIAL CELLS SEEN RARE GRAM POSITIVE COCCI IN PAIRS    Culture   Final    Consistent with normal respiratory flora. Performed at [facility], [address] [place], [place] [address]    Report Status 10/30/2017 FINAL  Final  Culture, Urine     Status: None   Collection Time: 10/28/17  8:12 AM  Result Value Ref Range Status   Specimen Description URINE, CATHETERIZED  Final   Special Requests NONE  Final   Culture   Final    NO GROWTH Performed at [facility], [address], [place], [place] [address]    Report Status 10/29/2017 FINAL  Final  Culture, blood (Routine X 2) w Reflex to ID Panel     Status: None (Preliminary result)   Collection Time: 10/28/17 10:52 AM  Result Value Ref Range Status   Specimen Description BLOOD LEFT HAND  Final  Special Requests   Final    BOTTLES DRAWN AEROBIC AND ANAEROBIC Blood Culture adequate volume   Culture   Final    NO GROWTH 4 DAYS Performed at [facility], [address], [place], [place] [address]    Report Status PENDING  Incomplete  Culture, blood (Routine X 2) w Reflex to ID Panel     Status: None (Preliminary result)   Collection Time: 10/28/17 11:08 AM  Result Value Ref Range Status   Specimen Description BLOOD LEFT HAND  Final   Special Requests IN PEDIATRIC BOTTLE Blood Culture  adequate volume  Final   Culture   Final    NO GROWTH 4 DAYS Performed at [facility], [address] [place], [place] [address]    Report Status PENDING  Incomplete    Anti-infectives:  Anti-infectives (From admission, onward)   Start     Dose/Rate Route Frequency Ordered Stop   11/01/17 1030  vancomycin (VANCOCIN) IVPB 1000 mg/200 mL premix  Status:  Discontinued     1,000 mg 200 mL/hr over 60 Minutes Intravenous 2 times daily 11/01/17 1016 11/02/17 0939   10/28/17 2200  vancomycin (VANCOCIN) IVPB 750 mg/150 ml premix  Status:  Discontinued     750 mg 150 mL/hr over 60 Minutes Intravenous Every 12 hours 10/28/17 0812 11/01/17 1016   10/28/17 0900  ceFEPIme (MAXIPIME) 2 g in sodium chloride 0.9 % 100 mL IVPB  Status:  Discontinued     2 g 200 mL/hr over 30 Minutes Intravenous Every 12 hours 10/28/17 0813 11/02/17 0939   10/28/17 0900  vancomycin (VANCOCIN) 2,000 mg in sodium chloride 0.9 % 500 mL IVPB     2,000 mg 250 mL/hr over 120 Minutes Intravenous  Once 10/28/17 0825 10/28/17 1233   10/28/17 0815  Vancomycin HCl in Dextrose 2-5 GM/500ML-% SOLN 2,000 mg  Status:  Discontinued     2,000 mg Intravenous  Once 10/28/17 0812 10/28/17 0824   10/28/17 0800  ceFEPIme (MAXIPIME) 2 g in sodium chloride 0.9 % 100 mL IVPB  Status:  Discontinued     2 g 200 mL/hr over 30 Minutes Intravenous Every 24 hours 10/28/17 0755 10/28/17 0813   10/23/2017 1700  ceFAZolin (ANCEF) IVPB 2g/100 mL premix     2 g 200 mL/hr over 30 Minutes Intravenous Every 8 hours 10/25/2017 1127 10/27/17 0634   10/18/2017 1016  vancomycin (VANCOCIN) powder  Status:  Discontinued       As needed 10/24/2017 1016 10/19/2017 1029   10/09/2017 0900  ceFAZolin (ANCEF) 3 g in dextrose 5 % 50 mL IVPB     3 g 130 mL/hr over 30 Minutes Intravenous  Once 10/08/2017 0856 10/02/2017 0850   10/24/17 1100  ceFAZolin (ANCEF) IVPB 2g/100 mL premix     2 g 200 mL/hr over 30 Minutes Intravenous On call to O.R. 10/24/17 1007 10/25/17 0559       Best Practice/Protocols:  VTE Prophylaxis: Lovenox (prophylaxtic dose) and Mechanical GI Prophylaxis: Proton Pump Inhibitor Continous Sedation Propofol only.  Consults: Treatment Team:  Roby Lofts, MD Tressie Stalker, MD Coletta Memos, MD    Events:  Subjective:    Overnight Issues: No specific issues  Objective:  Vital signs for last 24 hours: Temp:  [97 F (36.1 C)-101.7 F (38.7 C)] 99.5 F (37.5 C) (03/06 0900) Pulse Rate:  [60-86] 73 (03/06 0900) Resp:  [19-34] 26 (03/06 0900) BP: (125-193)/(59-99) 140/62 (03/06 0900) SpO2:  [94 %-100 %] 99 % (03/06  0900) FiO2 (%):  [40 %] 40 % (03/06 0900)  Hemodynamic parameters for last 24 hours:    Intake/Output from previous day: 03/05 0701 - 03/06 0700 In: 3809.3 [I.V.:2009.3; NG/GT:1200; IV Piggyback:600] Out: 2695 [Urine:2695]  Intake/Output this shift: Total I/O In: 189.1 [I.V.:89.1; IV Piggyback:100] Out: 120 [Urine:120]  Vent settings for last 24 hours: Vent Mode: PRVC FiO2 (%):  [40 %] 40 % Set Rate:  [18 bmp] 18 bmp Vt Set:  [620 mL] 620 mL PEEP:  [5 cmH20] 5 cmH20 Pressure Support:  [5 cmH20] 5 cmH20 Plateau Pressure:  [11 cmH20-19 cmH20] 19 cmH20  Physical Exam:  General: no respiratory distress and not responding. Neuro: RASS -1 and RASS -2 Resp: clear to auscultation bilaterally CVS: regular rate and rhythm, S1, S2 normal, no murmur, click, rub or gallop GI: soft, nontender, BS WNL, no r/g and tolerating tube feedings.  Needs free water Extremities: edema 1+  Results for orders placed or performed during the hospital encounter of 10/11/2017 (from the past 24 hour(s))  CBC     Status: Abnormal   Collection Time: 11/02/17  5:07 AM  Result Value Ref Range   WBC 13.6 (H) 4.0 - 10.5 K/uL   RBC 2.69 (L) 4.22 - 5.81 MIL/uL   Hemoglobin 8.4 (L) 13.0 - 17.0 g/dL   HCT 16.1 (L) 09.6 - 04.5 %   MCV 100.4 (H) 78.0 - 100.0 fL   MCH 31.2 26.0 - 34.0 pg   MCHC 31.1 30.0 - 36.0 g/dL   RDW 40.9  (H) 81.1 - 15.5 %   Platelets 216 150 - 400 K/uL  Basic metabolic panel     Status: Abnormal   Collection Time: 11/02/17  5:07 AM  Result Value Ref Range   Sodium 140 135 - 145 mmol/L   Potassium 3.7 3.5 - 5.1 mmol/L   Chloride 110 101 - 111 mmol/L   CO2 22 22 - 32 mmol/L   Glucose, Bld 121 (H) 65 - 99 mg/dL   BUN 37 (H) 6 - 20 mg/dL   Creatinine, Ser 9.14 0.61 - 1.24 mg/dL   Calcium 7.9 (L) 8.9 - 10.3 mg/dL   GFR calc non Af Amer >60 >60 mL/min   GFR calc Af Amer >60 >60 mL/min   Anion gap 8 5 - 15     Assessment/Plan:   NEURO  Altered Mental Status:  obtundation   Plan: TBI not resolved.  PULM  Atelectasis/collapse (focal and RLL atelectasis, but diffusely better)   Plan: CPM.  To get trach tomorrow.  CARDIO  No specific issues   Plan: CPM  RENAL  Urine output is good,   BUN in elevated, but creatinine is ok   Plan: CPM.  Add free water to enteral intake  GI  No specific issues.  Has previous abdominal scar and has had previous G tube   Plan: CPM  ID  All cultures negative.  Has left central line that needs to be replaced.  Needs PICC   Plan: DC left subclavian line.  Get PICC  HEME  Anemia acute blood loss anemia and anemia of critical illness)   Plan: No transfusion necessary currently  ENDO No specific issues   Plan: CPM  Global Issues  Patient is to get a trach and PEG tomorrow.  Time has not been determined yet.  Will stop antibiotics and change lines    LOS: 11 days   Additional comments:I reviewed the patient's new clinical lab test results. cbc/bmet and I reviewed  the patients new imaging test results. cxr  Critical Care Total Time*: 30 Minutes  Jimmye NormanJames Wells Mabe 11/02/2017  *Care during the described time interval was provided by me and/or other providers on the critical care team.  I have reviewed this patient's available data, including medical history, events of note, physical examination and test results as part of my evaluation.

## 2017-11-03 ENCOUNTER — Inpatient Hospital Stay: Payer: Self-pay

## 2017-11-03 ENCOUNTER — Encounter (HOSPITAL_COMMUNITY): Payer: Self-pay | Admitting: General Surgery

## 2017-11-03 ENCOUNTER — Encounter (HOSPITAL_COMMUNITY): Admission: EM | Disposition: E | Payer: Self-pay | Source: Home / Self Care

## 2017-11-03 HISTORY — PX: PERCUTANEOUS TRACHEOSTOMY: SHX5288

## 2017-11-03 HISTORY — PX: PEG PLACEMENT: SHX5437

## 2017-11-03 HISTORY — PX: ESOPHAGOGASTRODUODENOSCOPY: SHX5428

## 2017-11-03 LAB — TRIGLYCERIDES: Triglycerides: 201 mg/dL — ABNORMAL HIGH (ref <150)

## 2017-11-03 SURGERY — EGD (ESOPHAGOGASTRODUODENOSCOPY)
Anesthesia: Moderate Sedation

## 2017-11-03 SURGERY — CREATION, TRACHEOSTOMY, PERCUTANEOUS
Anesthesia: Choice

## 2017-11-03 MED ORDER — PIVOT 1.5 CAL PO LIQD: 1000.0000 mL | ORAL | Status: DC

## 2017-11-03 MED ORDER — LIDOCAINE-EPINEPHRINE (PF) 1.5 %-1:200000 IJ SOLN
INTRAMUSCULAR | Status: DC | PRN
  Administered 2017-11-03: 3 mL via PERINEURAL

## 2017-11-03 MED ORDER — MIDAZOLAM HCL 2 MG/2ML IJ SOLN
4.0000 mg | Freq: Once | INTRAMUSCULAR | Status: AC
  Administered 2017-11-03: 4 mg via INTRAVENOUS

## 2017-11-03 MED ORDER — POTASSIUM CHLORIDE 20 MEQ/15ML (10%) PO SOLN
40.0000 meq | Freq: Two times a day (BID) | ORAL | Status: DC
  Administered 2017-11-03 – 2017-11-05 (×5): 40 meq
  Filled 2017-11-03 (×6): qty 30

## 2017-11-03 MED ORDER — SODIUM CHLORIDE 0.9% FLUSH: 10.0000 mL | INTRAVENOUS | Status: DC | PRN

## 2017-11-03 MED ORDER — VECURONIUM BROMIDE 10 MG IV SOLR
INTRAVENOUS | Status: AC
  Administered 2017-11-03: 10 mg via INTRAVENOUS
  Filled 2017-11-03: qty 10

## 2017-11-03 MED ORDER — ACETAMINOPHEN 160 MG/5ML PO SOLN
650.0000 mg | Freq: Four times a day (QID) | ORAL | Status: DC | PRN
  Administered 2017-11-04 – 2017-11-05 (×4): 650 mg
  Filled 2017-11-03 (×3): qty 20.3

## 2017-11-03 MED ORDER — ORAL CARE MOUTH RINSE
15.0000 mL | OROMUCOSAL | Status: DC
  Administered 2017-11-03 – 2017-11-05 (×7): 15 mL via OROMUCOSAL

## 2017-11-03 MED ORDER — PIVOT 1.5 CAL PO LIQD
1000.0000 mL | ORAL | Status: DC
  Administered 2017-11-03 – 2017-11-04 (×2): 1000 mL

## 2017-11-03 MED ORDER — SODIUM CHLORIDE 0.9% FLUSH
10.0000 mL | Freq: Two times a day (BID) | INTRAVENOUS | Status: DC
  Administered 2017-11-04: 20 mL

## 2017-11-03 MED ORDER — PRO-STAT SUGAR FREE PO LIQD: 60.0000 mL | Freq: Four times a day (QID) | ORAL | Status: DC

## 2017-11-03 MED ORDER — CHLORHEXIDINE GLUCONATE CLOTH 2 % EX PADS
6.0000 | MEDICATED_PAD | Freq: Every day | CUTANEOUS | Status: DC
  Administered 2017-11-04 – 2017-11-05 (×3): 6 via TOPICAL

## 2017-11-03 MED ORDER — VECURONIUM BROMIDE 10 MG IV SOLR
10.0000 mg | Freq: Once | INTRAVENOUS | Status: AC
  Administered 2017-11-03: 10 mg via INTRAVENOUS

## 2017-11-03 MED ORDER — PRO-STAT SUGAR FREE PO LIQD
60.0000 mL | Freq: Four times a day (QID) | ORAL | Status: DC
  Administered 2017-11-03 – 2017-11-05 (×9): 60 mL
  Filled 2017-11-03 (×9): qty 60

## 2017-11-03 MED ORDER — VECURONIUM BROMIDE 10 MG IV SOLR
INTRAVENOUS | Status: AC
  Filled 2017-11-03: qty 10

## 2017-11-03 SURGICAL SUPPLY — 34 items
DRAPE HALF SHEET 40X57 (DRAPES) ×12 IMPLANT
DRAPE UTILITY XL STRL (DRAPES) ×3 IMPLANT
ELECT CAUTERY BLADE 6.4 (BLADE) ×3 IMPLANT
ELECT REM PT RETURN 9FT ADLT (ELECTROSURGICAL) ×3
ELECTRODE REM PT RTRN 9FT ADLT (ELECTROSURGICAL) ×1 IMPLANT
GAUZE SPONGE 4X4 16PLY XRAY LF (GAUZE/BANDAGES/DRESSINGS) ×3 IMPLANT
GLOVE BIO SURGEON STRL SZ 6 (GLOVE) IMPLANT
GLOVE BIO SURGEON STRL SZ 6.5 (GLOVE) ×2 IMPLANT
GLOVE BIO SURGEON STRL SZ7.5 (GLOVE) ×3 IMPLANT
GLOVE BIO SURGEON STRL SZ8 (GLOVE) IMPLANT
GLOVE BIO SURGEONS STRL SZ 6.5 (GLOVE) ×1
GLOVE BIOGEL PI IND STRL 6.5 (GLOVE) IMPLANT
GLOVE BIOGEL PI IND STRL 7.5 (GLOVE) ×1 IMPLANT
GLOVE BIOGEL PI IND STRL 8 (GLOVE) ×1 IMPLANT
GLOVE BIOGEL PI INDICATOR 6.5 (GLOVE)
GLOVE BIOGEL PI INDICATOR 7.5 (GLOVE) ×2
GLOVE BIOGEL PI INDICATOR 8 (GLOVE) ×2
GLOVE ECLIPSE 7.5 STRL STRAW (GLOVE) ×3 IMPLANT
GOWN STRL REUS W/ TWL LRG LVL3 (GOWN DISPOSABLE) ×2 IMPLANT
GOWN STRL REUS W/ TWL XL LVL3 (GOWN DISPOSABLE) ×1 IMPLANT
GOWN STRL REUS W/TWL LRG LVL3 (GOWN DISPOSABLE) ×6
GOWN STRL REUS W/TWL XL LVL3 (GOWN DISPOSABLE) ×3
INTRODUCER TRACH BLUE RHINO 6F (TUBING) IMPLANT
INTRODUCER TRACH BLUE RHINO 8F (TUBING) ×3 IMPLANT
NS IRRIG 1000ML POUR BTL (IV SOLUTION) IMPLANT
PENCIL BUTTON HOLSTER BLD 10FT (ELECTRODE) ×3 IMPLANT
SPONGE DRAIN TRACH 4X4 STRL 2S (GAUZE/BANDAGES/DRESSINGS) ×3 IMPLANT
SPONGE INTESTINAL PEANUT (DISPOSABLE) ×3 IMPLANT
SUT SILK 2 0 SH CR/8 (SUTURE) ×3 IMPLANT
SUT VICRYL AB 3 0 TIES (SUTURE) ×3 IMPLANT
TOWEL OR 17X24 6PK STRL BLUE (TOWEL DISPOSABLE) ×3 IMPLANT
TUBE CONNECTING 12'X1/4 (SUCTIONS) ×1
TUBE CONNECTING 12X1/4 (SUCTIONS) ×2 IMPLANT
YANKAUER SUCT BULB TIP NO VENT (SUCTIONS) ×3 IMPLANT

## 2017-11-03 NOTE — Progress Notes (Signed)
Nutrition Follow-up  DOCUMENTATION CODES:   Obesity unspecified  INTERVENTION:   Continue:  Pivot 1.5 @ 25 ml/hr (600 ml/day) 60 ml Prostat QID  Provides: 1700 kcal, 176 grams protein, and 455 ml free water. Total free water: 1255 ml  TF regimen and propofol at current rate providing 2072 total kcal/day    Adjust TF as appropriate for propofol  Recommend obtain new weight  NUTRITION DIAGNOSIS:   Increased nutrient needs related to wound healing as evidenced by estimated needs. Ongoing.   GOAL:   Provide needs based on ASPEN/SCCM guidelines Met.   MONITOR:   TF tolerance, Skin  ASSESSMENT:   Pt with PMH of polysubstance abuse, previous TBI, hepatitis C admitted overnight as a PHBC with large R basal ganglia intraparenchymal hematoma with shift, scattered SDH and SAH, bilateral nasal bone fx, L anterior maxilla fx, multiple tooth avulsion, complex forehead laceration, grade II splenic lac, and bilateral pulmonary aspiration.   Pt discussed during ICU rounds and with RN.   Patient is currently intubated on ventilator support MV: 10.8 L/min Temp (24hrs), Avg:99.3 F (37.4 C), Min:97.9 F (36.6 C), Max:102 F (38.9 C)  Propofol: 14.1 ml/hr provides: 372 kcal per day - hopeful to wean Medications reviewed and include: 40 mEq KCl BID Labs reviewed: TG: 201 (H)   200 ml free water every 6 hrs   Diet Order:  Diet NPO time specified  EDUCATION NEEDS:   No education needs have been identified at this time  Skin:  Skin Assessment: Reviewed RN Assessment  Last BM:  3/6  Height:   Ht Readings from Last 1 Encounters:  10/23/17 6' (1.829 m)    Weight:   Wt Readings from Last 1 Encounters:  10/23/17 258 lb 13.1 oz (117.4 kg)    Ideal Body Weight:  80.9 kg  BMI:  Body mass index is 35.1 kg/m.  Estimated Nutritional Needs:   Kcal:  0034-9179  Protein:  >161 grams  Fluid:  >2 L/day  Maylon Peppers RD, LDN, CNSC [phone] Pager [phone] After Hours  Pager

## 2017-11-03 NOTE — Progress Notes (Signed)
Peripherally Inserted Central Catheter/Midline Placement  The IV Nurse has discussed with the patient and/or persons authorized to consent for the patient, the purpose of this procedure and the potential benefits and risks involved with this procedure.  The benefits include less needle sticks, lab draws from the catheter, and the patient may be discharged home with the catheter. Risks include, but not limited to, infection, bleeding, blood clot (thrombus formation), and puncture of an artery; nerve damage and irregular heartbeat and possibility to perform a PICC exchange if needed/ordered by physician.  Alternatives to this procedure were also discussed.  Bard Power PICC patient education guide, fact sheet on infection prevention and patient information card has been provided to patient /or left at bedside.    PICC/Midline Placement Documentation  PICC Double Lumen 11/26/2017 PICC Right Basilic 43 cm 0 cm (Active)  Indication for Insertion or Continuance of Line Prolonged intravenous therapies 11/05/2017  9:00 AM  Exposed Catheter (cm) 0 cm 11/25/2017  9:00 AM  Site Assessment Clean;Dry;Intact 11/18/2017  9:00 AM  Lumen #1 Status Flushed;Blood return noted 11/27/2017  9:00 AM  Lumen #2 Status Flushed;Blood return noted 11/07/2017  9:00 AM  Dressing Type Transparent 11/08/2017  9:00 AM  Dressing Status Clean;Dry;Intact;Antimicrobial disc in place 11/21/2017  9:00 AM  Dressing Intervention New dressing 11/02/2017  9:00 AM  Dressing Change Due 11/10/17 10/28/2017  9:00 AM    Consent signed by life partner at bedside   Jeremiah GreenlandLumban, [name] Mathews 11/20/2017, 9:36 AM

## 2017-11-03 NOTE — Progress Notes (Signed)
RT assisted Dr. Lindie SpruceWyatt with bedside trach.

## 2017-11-03 NOTE — Progress Notes (Signed)
Follow up - Trauma and Critical Care  Patient Details:    [name] is an 47 y.o. male.  Lines/tubes : Airway 7.5 mm (Active)  Secured at (cm) 25 cm 11/23/2017  3:58 AM  Measured From Lips 11/15/2017  3:58 AM  Secured Location Left 10/28/2017  3:58 AM  Secured By [employer] Tube Holder 11/09/2017  3:58 AM  Tube Holder Repositioned Yes 11/26/2017  3:58 AM  Cuff Pressure (cm H2O) 28 cm H2O 11/02/2017  8:23 PM  Site Condition Dry 11/02/2017  8:23 PM     CVC Double Lumen 10/23/17 Left Subclavian 16 cm (Active)  Indication for Insertion or Continuance of Line Administration of hyperosmolar/irritating solutions (i.e. TPN, Vancomycin, etc.) 11/02/2017  8:00 PM  Site Assessment Clean;Dry;Intact 11/02/2017  8:00 PM  Proximal Lumen Status Infusing 11/02/2017  8:00 PM  Distal Lumen Status In-line blood sampling system in place 11/02/2017  8:00 PM  Dressing Type Transparent;Occlusive 11/02/2017  8:00 PM  Dressing Status Clean;Dry;Intact;Antimicrobial disc in place 11/02/2017  8:00 PM  Line Care Connections checked and tightened 11/02/2017  8:00 PM  Dressing Intervention Other (Comment) 10/31/2017  8:00 AM  Dressing Change Due 02/28/2018 11/02/2017  8:00 PM     NG/OG Tube Orogastric 16 Fr. Right mouth Aucultation (Active)  Site Assessment Clean;Dry;Intact 11/02/2017  8:00 PM  Ongoing Placement Verification No acute changes, not attributed to clinical condition;No change in respiratory status 11/02/2017  8:00 PM  Status Infusing tube feed 11/02/2017  8:00 PM  Drainage Appearance Bile 10/31/2017  8:00 AM  Output (mL) 150 mL 10/31/2017 12:00 PM     Urethral Catheter Jessica Ferrainolo Temperature probe 16 Fr. (Active)  Indication for Insertion or Continuance of Catheter Other (comment) 11/02/2017  8:00 PM  Site Assessment Clean;Intact 11/02/2017  8:00 PM  Catheter Maintenance Bag below level of bladder;Insertion date on drainage bag;Catheter secured;No dependent loops;Drainage bag/tubing not touching floor;Seal intact 11/02/2017  8:00 PM   Collection Container Standard drainage bag 11/02/2017  8:00 PM  Securement Method Securing device (Describe) 11/02/2017  8:00 PM  Urinary Catheter Interventions Unclamped 11/02/2017  8:00 AM  Output (mL) 1850 mL 10/30/2017  6:07 AM    Microbiology/Sepsis markers: Results for orders placed or performed during the hospital encounter of 10/06/2017  Culture, respiratory (NON-Expectorated)     Status: None   Collection Time: 10/28/17  8:12 AM  Result Value Ref Range Status   Specimen Description TRACHEAL ASPIRATE  Final   Special Requests NONE  Final   Gram Stain   Final    MODERATE WBC PRESENT, PREDOMINANTLY PMN NO SQUAMOUS EPITHELIAL CELLS SEEN RARE GRAM POSITIVE COCCI IN PAIRS    Culture   Final    Consistent with normal respiratory flora. Performed at [facility], [address] [place], [place] [address]    Report Status 10/30/2017 FINAL  Final  Culture, Urine     Status: None   Collection Time: 10/28/17  8:12 AM  Result Value Ref Range Status   Specimen Description URINE, CATHETERIZED  Final   Special Requests NONE  Final   Culture   Final    NO GROWTH Performed at [facility], [address] [place], [place] [address]    Report Status 10/29/2017 FINAL  Final  Culture, blood (Routine X 2) w Reflex to ID Panel     Status: None   Collection Time: 10/28/17 10:52 AM  Result Value Ref Range Status   Specimen Description BLOOD LEFT HAND  Final   Special  Requests   Final    BOTTLES DRAWN AEROBIC AND ANAEROBIC Blood Culture adequate volume   Culture   Final    NO GROWTH 5 DAYS Performed at [facility], [address] [place], [place] [address]    Report Status 11/02/2017 FINAL  Final  Culture, blood (Routine X 2) w Reflex to ID Panel     Status: None   Collection Time: 10/28/17 11:08 AM  Result Value Ref Range Status   Specimen Description BLOOD LEFT HAND  Final   Special Requests IN PEDIATRIC BOTTLE Blood Culture adequate volume  Final   Culture   Final     NO GROWTH 5 DAYS Performed at [facility], [address], [place], [place] [address]    Report Status 11/02/2017 FINAL  Final    Anti-infectives:  Anti-infectives (From admission, onward)   Start     Dose/Rate Route Frequency Ordered Stop   11/01/17 1030  vancomycin (VANCOCIN) IVPB 1000 mg/200 mL premix  Status:  Discontinued     1,000 mg 200 mL/hr over 60 Minutes Intravenous 2 times daily 11/01/17 1016 11/02/17 0939   10/28/17 2200  vancomycin (VANCOCIN) IVPB 750 mg/150 ml premix  Status:  Discontinued     750 mg 150 mL/hr over 60 Minutes Intravenous Every 12 hours 10/28/17 0812 11/01/17 1016   10/28/17 0900  ceFEPIme (MAXIPIME) 2 g in sodium chloride 0.9 % 100 mL IVPB  Status:  Discontinued     2 g 200 mL/hr over 30 Minutes Intravenous Every 12 hours 10/28/17 0813 11/02/17 0939   10/28/17 0900  vancomycin (VANCOCIN) 2,000 mg in sodium chloride 0.9 % 500 mL IVPB     2,000 mg 250 mL/hr over 120 Minutes Intravenous  Once 10/28/17 0825 10/28/17 1233   10/28/17 0815  Vancomycin HCl in Dextrose 2-5 GM/500ML-% SOLN 2,000 mg  Status:  Discontinued     2,000 mg Intravenous  Once 10/28/17 0812 10/28/17 0824   10/28/17 0800  ceFEPIme (MAXIPIME) 2 g in sodium chloride 0.9 % 100 mL IVPB  Status:  Discontinued     2 g 200 mL/hr over 30 Minutes Intravenous Every 24 hours 10/28/17 0755 10/28/17 0813   2017-11-25 1700  ceFAZolin (ANCEF) IVPB 2g/100 mL premix     2 g 200 mL/hr over 30 Minutes Intravenous Every 8 hours 2017/11/25 1127 10/27/17 0634   2017/11/25 1016  vancomycin (VANCOCIN) powder  Status:  Discontinued       As needed Nov 25, 2017 1016 11/25/2017 1029   25-Nov-2017 0900  ceFAZolin (ANCEF) 3 g in dextrose 5 % 50 mL IVPB     3 g 130 mL/hr over 30 Minutes Intravenous  Once 11-25-2017 0856 2017/11/25 0850   10/24/17 1100  ceFAZolin (ANCEF) IVPB 2g/100 mL premix     2 g 200 mL/hr over 30 Minutes Intravenous On call to O.R. 10/24/17 1007 10/25/17 0559      Best Practice/Protocols:  VTE  Prophylaxis: Lovenox (prophylaxtic dose) and Mechanical GI Prophylaxis: Proton Pump Inhibitor Continous Sedation  Consults: Treatment Team:  Roby Lofts, MD Tressie Stalker, MD Coletta Memos, MD    Events:  Subjective:    Overnight Issues: Nothing changed overnight.  Objective:  Vital signs for last 24 hours: Temp:  [97.9 F (36.6 C)-102 F (38.9 C)] 98.1 F (36.7 C) (03/07 0700) Pulse Rate:  [64-105] 73 (03/07 0700) Resp:  [23-35] 24 (03/07 0700) BP: (117-183)/(53-115) 125/66 (03/07 0700) SpO2:  [87 %-100 %] 99 % (03/07 0700) FiO2 (%):  [  30 %-40 %] 30 % (03/07 0400)  Hemodynamic parameters for last 24 hours:    Intake/Output from previous day: 03/06 0701 - 03/07 0700 In: 3100.1 [I.V.:2175.1; NG/GT:825; IV Piggyback:100] Out: 3575 [Urine:3575]  Intake/Output this shift: No intake/output data recorded.  Vent settings for last 24 hours: Vent Mode: PRVC FiO2 (%):  [30 %-40 %] 30 % Set Rate:  [18 bmp] 18 bmp Vt Set:  [620 mL] 620 mL PEEP:  [5 cmH20] 5 cmH20 Plateau Pressure:  [11 cmH20-19 cmH20] 18 cmH20  Physical Exam:  General: no respiratory distress Neuro: RASS -3 or deeper Resp: clear to auscultation bilaterally CVS: regular rate and rhythm, S1, S2 normal, no murmur, click, rub or gallop GI: soft, nontender, BS WNL, no r/g and Has a mark at previous G-tube site Extremities: edema 1+  No results found for this or any previous visit (from the past 24 hour(s)).   Assessment/Plan:   NEURO  Altered Mental Status:  agitation, obtundation and chronic vegetative   Plan: No changes.  Will keep sedated until after procedures   PULM  No CXR today, but oxygenating and ventilating well.   Plan: Janina Mayo today, then will try to start weaning to trach collar  CARDIO  No issues   Plan: CPM  RENAL  Good urine output and renal function has been fine.   Plan: CPM  GI  No issues.  Tube feeding is off.  To get PEG today   Plan: Resume tue feedings after PEG  and trach today.  ID  No current known infectious sources.  Antibiotic stopped yesterday.   Plan: CPM.  May consider prophylactic antibiotic for trach today.  HEME  Hemoglobin not checked today.  Will repat labs on Saturday.   Plan: CPM  ENDO No specific issues   Plan: CPM  Global Issues  Trach and PEG today.  Will perform PEG at 1030 and trach about 1.    LOS: 12 days   Additional comments:I have discussed and reviewed with family members patient's partner  Critical Care Total Time*: 30 Minutes  Jimmye Norman 11/17/2017  *Care during the described time interval was provided by me and/or other providers on the critical care team.  I have reviewed this patient's available data, including medical history, events of note, physical examination and test results as part of my evaluation.

## 2017-11-03 NOTE — Progress Notes (Signed)
Patient ID: Jeremiah RosenthalSky Mathews, male   DOB: 06/16/1971, 47 y.o.   MRN: [id number] BP 140/78 (BP Location: Left Arm)   Pulse 88   Temp 98.2 F (36.8 C) (Bladder)   Resp (!) 21   Ht 6' (1.829 m)   Wt 117.4 kg (258 lb 13.1 oz)   SpO2 94%   BMI 35.10 kg/m   Comatose Exam is unchanged.  No new recommendations.

## 2017-11-03 NOTE — Op Note (Signed)
[facility] Patient Name: [name] Procedure Date : 10/29/2017 MRN: [id number] Attending MD: Kathrin Ruddy, MD Date of Birth: 31-Jan-1971 CSN: [id number] Age: 47 Admit Type: Inpatient Procedure:                Upper GI endoscopy Indications:              Place PEG because patient is unable to eat, Place                            PEG because patient is unable to eat due to                            intracranial injury, Place PEG because patient is                            comatose, Place PEG because patient requires                            ventilator support Providers:                Jimmye Norman, MD, Verita Schneiders, Technician, Harrington Challenger, Technician Referring MD:              Medicines:                Fentanyl 100 micrograms IV, Midazolam 4 mg IV,                            norcuron 10 mg IV Complications:            No immediate complications. Estimated Blood Loss:     Estimated blood loss was minimal. Procedure:                Pre-Anesthesia Assessment:                           - Prior to the procedure, a History and Physical                            was performed, and patient medications and                            allergies were reviewed. The patient is unable to                            give consent secondary to the patient's altered                            mental status. The risks and benefits of the                            procedure and the sedation options and risks were  discussed with the patient's partner. All questions                            were answered and informed consent was obtained.                            Patient identification and proposed procedure were                            verified by the physician [name] Room. Mental                            Status Examination: obtunded. Airway Examination:                            nasotracheal intubation. Respiratory  Examination:                            clear to auscultation. CV Examination: RRR, no                            murmurs, no S3 or S4. ASA Grade Assessment: IV - A                            patient with severe systemic disease that is a                            constant threat to life. After reviewing the risks                            and benefits, the patient was deemed in                            satisfactory condition to undergo the procedure.                            The anesthesia plan was to use deep sedation /                            analgesia. Immediately prior to administration of                            medications, the patient was re-assessed for                            adequacy to receive sedatives. The heart rate,                            respiratory rate, oxygen saturations, blood                            pressure, adequacy of pulmonary ventilation, and  response to care were monitored throughout the                            procedure. The physical status of the patient was                            re-assessed after the procedure.                           After obtaining informed consent, the endoscope was                            passed under direct vision. Throughout the                            procedure, the patient's blood pressure, pulse, and                            oxygen saturations were monitored continuously. The                            Endoscope was introduced through the mouth, and                            advanced to the. The upper GI endoscopy was                            accomplished without difficulty. The patient                            tolerated the procedure well. The total duration of                            the procedure was 21 minutes. The ZO-[id number]                            [phone]) scope was introduced through the mouth,                            and advanced to the second part  of duodenum. Scope In: Scope Out: Findings:      The examined esophagus was normal.      The entire examined stomach was normal.      The duodenal bulb, first portion of the duodenum and second portion of       the duodenum were normal.      The entire examined stomach was normal. The patient was placed in the       supine position for PEG placement. The stomach was insufflated to appose       gastric and abdominal walls. A site was located in the antrum of the       stomach with excellent manual external pressure for placement. The       abdominal wall was marked and prepped in a sterile manner. The area was       anesthetized with 4 mL of 1.5% lidocaine. The  trocar needle was       introduced through the abdominal wall and into the stomach under direct       endoscopic view. A snare was introduced through the endoscope and opened       in the gastric lumen. The guide wire was passed through the trocar and       into the open snare. The snare was closed around the guide wire. The       endoscope and snare were removed, pulling the wire out through the       mouth. A skin incision was made at the site of needle insertion. The       externally removable 24 Fr Bard gastrostomy tube was lubricated. The       G-tube was tied to the guide wire and pulled through the mouth and into       the stomach. The trocar needle was removed, and the gastrostomy tube was       pulled out from the stomach through the skin. The external bumper was       attached to the gastrostomy tube, and the tube was cut to remove the       guide wire. The final position of the gastrostomy tube was confirmed by       relook endoscopy, and skin marking noted to be 4.5 cm at the external       bumper. The final tension and compression of the abdominal wall by the       PEG tube and external bumper were checked and revealed that the bumper       was loose and lightly touching the skin. The feeding tube was capped,        and the tube site cleaned and dressed. Impression:               - Normal esophagus.                           - Normal stomach.                           - Normal duodenal bulb, first portion of the                            duodenum and second portion of the duodenum.                           - Normal stomach.                           - An externally removable PEG placement was                            successfully completed.                           - No specimens collected. Recommendation:           - Please follow the post-PEG recommendations                            including: may use PEG today for meds and water,  use PEG today after checked by physician and start                            using PEG today. Procedure Code(s):        --- Professional ---                           [phone], Esophagogastroduodenoscopy, flexible,                            transoral; with directed placement of percutaneous                            gastrostomy tube Diagnosis Code(s):        --- Professional ---                           R63.3, Feeding difficulties                           Z43.1, Encounter for attention to gastrostomy                           R40.20, Unspecified coma                           S06.9X0S, Unspecified intracranial injury without                            loss of consciousness, sequela CPT copyright 2016 [employer]. All rights reserved. The codes documented in this report are preliminary and upon coder review may  be revised to meet current compliance requirements. Kathrin Ruddy, MD Jimmye Norman III, MD 11-10-17 1:05:04 PM This report has been signed electronically. Number of Addenda: 0

## 2017-11-04 ENCOUNTER — Inpatient Hospital Stay (HOSPITAL_COMMUNITY): Payer: Medicare HMO

## 2017-11-04 ENCOUNTER — Encounter (HOSPITAL_COMMUNITY): Payer: Self-pay | Admitting: General Surgery

## 2017-11-04 MED ORDER — DEXMEDETOMIDINE HCL IN NACL 200 MCG/50ML IV SOLN
0.0000 ug/kg/h | INTRAVENOUS | Status: DC
  Administered 2017-11-04: 0.6 ug/kg/h via INTRAVENOUS
  Administered 2017-11-04 (×2): 0.4 ug/kg/h via INTRAVENOUS
  Administered 2017-11-04: 0.5 ug/kg/h via INTRAVENOUS
  Administered 2017-11-05: 1 ug/kg/h via INTRAVENOUS
  Administered 2017-11-05 (×2): 0.5 ug/kg/h via INTRAVENOUS
  Administered 2017-11-05: 1 ug/kg/h via INTRAVENOUS
  Filled 2017-11-04 (×2): qty 50
  Filled 2017-11-04 (×2): qty 100
  Filled 2017-11-04 (×2): qty 50

## 2017-11-04 NOTE — Progress Notes (Signed)
Follow up - Trauma and Critical Care  Patient Details:    [name] is an 47 y.o. male.  Lines/tubes : PICC Double Lumen 11/25/17 PICC Right Basilic 43 cm 0 cm (Active)  Indication for Insertion or Continuance of Line Prolonged intravenous therapies 2017-11-25  8:00 PM  Exposed Catheter (cm) 0 cm 11-25-17  9:00 AM  Site Assessment Clean;Dry;Intact 25-Nov-2017  8:00 PM  Lumen #1 Status Flushed;Blood return noted;Infusing 11/25/17  8:00 PM  Lumen #2 Status Flushed;Blood return noted;In-line blood sampling system in place;Infusing 2017-11-25  8:00 PM  Dressing Type Transparent;Occlusive 11-25-17  8:00 PM  Dressing Status Clean;Dry;Intact;Antimicrobial disc in place 11/25/2017  8:00 PM  Line Care Lumen 1 tubing changed 11/04/2017  4:00 AM  Dressing Intervention New dressing 11-25-17  9:00 AM  Dressing Change Due 11/10/17 11/25/2017  8:00 PM     Gastrostomy/Enterostomy Percutaneous endoscopic gastrostomy (PEG) 24 Fr. LUQ (Active)  Surrounding Skin Dry;Intact Nov 25, 2017  8:00 PM  Tube Status Patent 2017/11/25  8:00 PM  Dressing Status Clean;Dry;Intact Nov 25, 2017  8:00 PM  Dressing Intervention New dressing 11/25/17  8:00 PM  Dressing Type Split gauze;Abdominal Binder 11-25-2017  8:00 PM     Urethral Catheter Jessica Ferrainolo Temperature probe 16 Fr. (Active)  Indication for Insertion or Continuance of Catheter Other (comment) November 25, 2017  8:00 PM  Site Assessment Clean;Dry;Intact 11/25/17  8:00 PM  Catheter Maintenance Bag below level of bladder;Catheter secured;Drainage bag/tubing not touching floor;Insertion date on drainage bag;No dependent loops;Seal intact 11/25/2017  8:00 PM  Collection Container Standard drainage bag 2017/11/25  8:00 PM  Securement Method Securing device (Describe) Nov 25, 2017  8:00 PM  Urinary Catheter Interventions Unclamped 2017/11/25  8:00 PM  Output (mL) 125 mL 11/04/2017  7:00 AM    Microbiology/Sepsis markers: Results for orders placed or performed during the hospital encounter of  09/30/2017  Culture, respiratory (NON-Expectorated)     Status: None   Collection Time: 10/28/17  8:12 AM  Result Value Ref Range Status   Specimen Description TRACHEAL ASPIRATE  Final   Special Requests NONE  Final   Gram Stain   Final    MODERATE WBC PRESENT, PREDOMINANTLY PMN NO SQUAMOUS EPITHELIAL CELLS SEEN RARE GRAM POSITIVE COCCI IN PAIRS    Culture   Final    Consistent with normal respiratory flora. Performed at [facility], [address] [place], [place] [address]    Report Status 10/30/2017 FINAL  Final  Culture, Urine     Status: None   Collection Time: 10/28/17  8:12 AM  Result Value Ref Range Status   Specimen Description URINE, CATHETERIZED  Final   Special Requests NONE  Final   Culture   Final    NO GROWTH Performed at [facility], [address] [place], [place] [address]    Report Status 10/29/2017 FINAL  Final  Culture, blood (Routine X 2) w Reflex to ID Panel     Status: None   Collection Time: 10/28/17 10:52 AM  Result Value Ref Range Status   Specimen Description BLOOD LEFT HAND  Final   Special Requests   Final    BOTTLES DRAWN AEROBIC AND ANAEROBIC Blood Culture adequate volume   Culture   Final    NO GROWTH 5 DAYS Performed at [facility], [address], [place], [place] [address]    Report Status 11/02/2017 FINAL  Final  Culture, blood (Routine X 2) w Reflex to ID Panel     Status: None   Collection  Time: 10/28/17 11:08 AM  Result Value Ref Range Status   Specimen Description BLOOD LEFT HAND  Final   Special Requests IN PEDIATRIC BOTTLE Blood Culture adequate volume  Final   Culture   Final    NO GROWTH 5 DAYS Performed at [facility], [address] [place], [place] [address]    Report Status 11/02/2017 FINAL  Final    Anti-infectives:  Anti-infectives (From admission, onward)   Start     Dose/Rate Route Frequency Ordered Stop   11/01/17 1030  vancomycin (VANCOCIN) IVPB 1000 mg/200 mL premix  Status:   Discontinued     1,000 mg 200 mL/hr over 60 Minutes Intravenous 2 times daily 11/01/17 1016 11/02/17 0939   10/28/17 2200  vancomycin (VANCOCIN) IVPB 750 mg/150 ml premix  Status:  Discontinued     750 mg 150 mL/hr over 60 Minutes Intravenous Every 12 hours 10/28/17 0812 11/01/17 1016   10/28/17 0900  ceFEPIme (MAXIPIME) 2 g in sodium chloride 0.9 % 100 mL IVPB  Status:  Discontinued     2 g 200 mL/hr over 30 Minutes Intravenous Every 12 hours 10/28/17 0813 11/02/17 0939   10/28/17 0900  vancomycin (VANCOCIN) 2,000 mg in sodium chloride 0.9 % 500 mL IVPB     2,000 mg 250 mL/hr over 120 Minutes Intravenous  Once 10/28/17 0825 10/28/17 1233   10/28/17 0815  Vancomycin HCl in Dextrose 2-5 GM/500ML-% SOLN 2,000 mg  Status:  Discontinued     2,000 mg Intravenous  Once 10/28/17 0812 10/28/17 0824   10/28/17 0800  ceFEPIme (MAXIPIME) 2 g in sodium chloride 0.9 % 100 mL IVPB  Status:  Discontinued     2 g 200 mL/hr over 30 Minutes Intravenous Every 24 hours 10/28/17 0755 10/28/17 0813   10/07/2017 1700  ceFAZolin (ANCEF) IVPB 2g/100 mL premix     2 g 200 mL/hr over 30 Minutes Intravenous Every 8 hours 10/22/2017 1127 10/27/17 0634   10/07/2017 1016  vancomycin (VANCOCIN) powder  Status:  Discontinued       As needed 10/21/2017 1016 10/25/2017 1029   10/13/2017 0900  ceFAZolin (ANCEF) 3 g in dextrose 5 % 50 mL IVPB     3 g 130 mL/hr over 30 Minutes Intravenous  Once 10/21/2017 0856 10/08/2017 0850   10/24/17 1100  ceFAZolin (ANCEF) IVPB 2g/100 mL premix     2 g 200 mL/hr over 30 Minutes Intravenous On call to O.R. 10/24/17 1007 10/25/17 0559      Best Practice/Protocols:  VTE Prophylaxis: Lovenox (prophylaxtic dose) and Mechanical GI Prophylaxis: Proton Pump Inhibitor Propfol  Consults: Treatment Team:  Roby Lofts, MD Tressie Stalker, MD Coletta Memos, MD    Events:  Subjective:    Overnight Issues: Patient doing okay, stable.  Objective:  Vital signs for last 24 hours: Temp:  [98.2  F (36.8 C)-101.8 F (38.8 C)] 99.9 F (37.7 C) (03/08 0700) Pulse Rate:  [68-100] 95 (03/08 0700) Resp:  [18-40] 32 (03/08 0700) BP: (124-169)/(66-114) 157/101 (03/08 0700) SpO2:  [94 %-100 %] 97 % (03/08 0700) FiO2 (%):  [30 %-100 %] 30 % (03/08 0500)  Hemodynamic parameters for last 24 hours:    Intake/Output from previous day: 03/07 0701 - 03/08 0700 In: 3091.5 [I.V.:2087.4; NG/GT:1004.2] Out: 3325 [Urine:3325]  Intake/Output this shift: No intake/output data recorded.  Vent settings for last 24 hours: Vent Mode: PRVC FiO2 (%):  [30 %-100 %] 30 % Set Rate:  [18 bmp] 18 bmp Vt Set:  [191  mL] 620 mL PEEP:  [5 cmH20] 5 cmH20 Plateau Pressure:  [14 cmH20-23 cmH20] 18 cmH20  Physical Exam:  General: no respiratory distress Neuro: RASS -2 and No responsiveness. HEENT/Neck: trach-clean, intact Resp: clear to auscultation bilaterally CVS: regular rate and rhythm, S1, S2 normal, no murmur, click, rub or gallop GI: Soft, tolerating tube feedings well Extremities: edema 3+  Results for orders placed or performed during the hospital encounter of 10/14/2017 (from the past 24 hour(s))  Triglycerides     Status: Abnormal   Collection Time: 10/31/2017  2:10 PM  Result Value Ref Range   Triglycerides 201 (H) <150 mg/dL     Assessment/Plan:   NEURO  Altered Mental Status:  obtundation and coma   Plan: Still on sedation because of respirations and intermittent agitation  PULM  CXR pending, but at the bedside, he seems to be okay   Plan: Wean to trach collar if possible  CARDIO  No cardiac issues   Plan: CPM  RENAL  Urine output is good.   Plan: Cmet including renal function test  GI  New PEG in place and functioning well.   Plan: Continue tube feedings.  ID  No known new infectious source   Plan: CPM  HEME  Anemia acute blood loss anemia and anemia of critical illness)   Plan: Has not needed transfusion  ENDO No specific issues   Plan: CPM  Global Issues  Patient  doing okay.  Will attempt to wean to trach collar today. Last LFTs were in February.  Will recheck today.  CXR pending   LOS: 13 days   Additional comments:I have discussed and reviewed with family members patient's Partner  Critical Care Total Time*: 30 Minutes  Jimmye NormanJames Maelynn Moroney 11/04/2017  *Care during the described time interval was provided by me and/or other providers on the critical care team.  I have reviewed this patient's available data, including medical history, events of note, physical examination and test results as part of my evaluation.

## 2017-11-04 NOTE — Progress Notes (Signed)
RT attempted to place patient on trach collar.  Pt's RR increased to 44 - wean trail failed.  RT placed pt back on vent wean 8/5 and RR decreased to 33, pt tolerating well at this time.

## 2017-11-04 NOTE — Op Note (Signed)
OPERATIVE REPORT  DATE OF OPERATION: Nov 17, 2017  PATIENT:  [name]  47 y.o. male  PRE-OPERATIVE DIAGNOSIS:  DYSPHAGIA, ACUTE RESPIRATORY FAILURE  POST-OPERATIVE DIAGNOSIS:  DYSPHAGIA, ACUTE RESPIRATORY FAILURE  INDICATION(S) FOR OPERATION:  Long term ventilator management  FINDINGS:  Previous tracheostomy scarring  PROCEDURE:  Procedure(s): PERCUTANEOUS TRACHEOSTOMY WITH #8 SHILEY TRACHEOSTOMY TUBE  SURGEON:  Surgeon(s): Jimmye Norman, MD  ASSISTANT: Rayburn, PA-C  ANESTHESIA:   IV sedation and paralytic  COMPLICATIONS:  None  EBL: <10 ml  BLOOD ADMINISTERED: none  DRAINS: Urinary Catheter (Foley) and Gastrostomy Tube   SPECIMEN:  No Specimen  COUNTS CORRECT:  YES  PROCEDURE DETAILS: The procedure was performed at the bedside in the 4 N. intensive care unit.  Proper timeout was performed identifying the patient and procedure to be performed.  The previously had a gastrostomy tube placed endoscopically.  Roll was placed underneath the patient's shoulders and the neck was prepped and draped in the usual sterile manner.  Anesthetized the area where he had his previous tracheostomy with a 25-gauge needle and 1% Xylocaine local anesthetic without epinephrine.  We then made a transverse incision at the site of his previous scar.  Dissected down to the pretracheal fascia was low scarring when he had his previous tracheotomy.  This is the side that was cleaned off of electrocautery in order to exposing a trach in order to do the percutaneous tracheostomy tube.  The endotracheal tube was pulled back by the respiratory therapist as we palpated the tracheal lumen past the site of the previous tracheotomy.  We inserted the needle into the trachea aspirating air as we went and then passed a green wire through the needle into the distal trachea.  We then passed a short, firm, dark blue dilator over the wire into the trachea making the initial tracheotomy.  We then passed the serial Blue  Rhino dilator over the wire into the tracheotomy enlargement to the appropriate size a 28 French dilator inserted in size the tapered #8 Shiley tracheostomy tube was then inserted over the guide and wire into the tracheotomy into the proper position.  Once we were in the trachea with the tracheostomy tube the balloon was inflated and we connected the ventilator circuit to the tracheostomy and confirmed there was excellent volume return.  We secured the tracheostomy tube in place at the flanges with 3-0 nylon suture with a drain sponge underneath the flanges.  Secured in place then subsequently performed a bronchoscopy through the endotracheal tube confirming the intratracheal placement of the tracheostomy tube into the tracheostomy tube itself into the distal trachea of the carina aspirating secretions.  All needle counts, sponge counts, and instrument counts were correct.  PATIENT DISPOSITION:  Patient remained in the ICU, stable    Jimmye Norman 3/8/20196:54 AM
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
## 2017-11-05 ENCOUNTER — Inpatient Hospital Stay (HOSPITAL_COMMUNITY): Payer: Medicare HMO

## 2017-11-05 LAB — COMPREHENSIVE METABOLIC PANEL
ALBUMIN: 2.4 g/dL — AB (ref 3.5–5.0)
ALT: 40 U/L (ref 17–63)
AST: 82 U/L — ABNORMAL HIGH (ref 15–41)
Alkaline Phosphatase: 93 U/L (ref 38–126)
Anion gap: 11 (ref 5–15)
BUN: 31 mg/dL — AB (ref 6–20)
CHLORIDE: 105 mmol/L (ref 101–111)
CO2: 18 mmol/L — AB (ref 22–32)
CREATININE: 0.76 mg/dL (ref 0.61–1.24)
Calcium: 8 mg/dL — ABNORMAL LOW (ref 8.9–10.3)
GFR calc Af Amer: >60 mL/min (ref >60)
GFR calc non Af Amer: >60 mL/min (ref >60)
Glucose, Bld: 155 mg/dL — ABNORMAL HIGH (ref 65–99)
POTASSIUM: 4 mmol/L (ref 3.5–5.1)
SODIUM: 134 mmol/L — AB (ref 135–145)
Total Bilirubin: 1.2 mg/dL (ref 0.3–1.2)
Total Protein: 6.8 g/dL (ref 6.5–8.1)

## 2017-11-05 LAB — BASIC METABOLIC PANEL
Anion gap: 9 (ref 5–15)
BUN: 34 mg/dL — ABNORMAL HIGH (ref 6–20)
CALCIUM: 8.1 mg/dL — AB (ref 8.9–10.3)
CO2: 19 mmol/L — AB (ref 22–32)
CREATININE: 0.79 mg/dL (ref 0.61–1.24)
Chloride: 107 mmol/L (ref 101–111)
Glucose, Bld: 162 mg/dL — ABNORMAL HIGH (ref 65–99)
Potassium: 4 mmol/L (ref 3.5–5.1)
SODIUM: 135 mmol/L (ref 135–145)

## 2017-11-05 LAB — CBC WITH DIFFERENTIAL/PLATELET
BASOS ABS: 0 10*3/uL (ref 0.0–0.1)
BASOS PCT: 0 %
EOS PCT: 3 %
Eosinophils Absolute: 0.3 10*3/uL (ref 0.0–0.7)
HCT: 30 % — ABNORMAL LOW (ref 39.0–52.0)
Hemoglobin: 9.5 g/dL — ABNORMAL LOW (ref 13.0–17.0)
LYMPHS PCT: 15 %
Lymphs Abs: 1.8 10*3/uL (ref 0.7–4.0)
MCH: 31.5 pg (ref 26.0–34.0)
MCHC: 31.7 g/dL (ref 30.0–36.0)
MCV: 99.3 fL (ref 78.0–100.0)
Monocytes Absolute: 0.6 10*3/uL (ref 0.1–1.0)
Monocytes Relative: 5 %
Neutro Abs: 9 10*3/uL — ABNORMAL HIGH (ref 1.7–7.7)
Neutrophils Relative %: 77 %
PLATELETS: 287 10*3/uL (ref 150–400)
RBC: 3.02 MIL/uL — AB (ref 4.22–5.81)
RDW: 17.7 % — AB (ref 11.5–15.5)
WBC: 11.8 10*3/uL — ABNORMAL HIGH (ref 4.0–10.5)

## 2017-11-05 LAB — POCT I-STAT 3, ART BLOOD GAS (G3+)
ACID-BASE DEFICIT: 5 mmol/L — AB (ref 0.0–2.0)
Bicarbonate: 19.9 mmol/L — ABNORMAL LOW (ref 20.0–28.0)
O2 Saturation: 98 %
PH ART: 7.373 (ref 7.350–7.450)
TCO2: 21 mmol/L — ABNORMAL LOW (ref 22–32)
pCO2 arterial: 34 mmHg (ref 32.0–48.0)
pO2, Arterial: 98 mmHg (ref 83.0–108.0)

## 2017-11-05 LAB — TROPONIN I: Troponin I: <0.03 ng/mL (ref <0.03)

## 2017-11-05 LAB — MAGNESIUM: Magnesium: 2.4 mg/dL (ref 1.7–2.4)

## 2017-11-05 MED ORDER — ATROPINE SULFATE 1 MG/10ML IJ SOSY
PREFILLED_SYRINGE | INTRAMUSCULAR | Status: AC
  Filled 2017-11-05: qty 10

## 2017-11-05 MED ORDER — SODIUM CHLORIDE 0.9 % IV BOLUS (SEPSIS)
2000.0000 mL | Freq: Once | INTRAVENOUS | Status: AC
  Administered 2017-11-05: 2000 mL via INTRAVENOUS

## 2017-11-05 MED ORDER — DEXMEDETOMIDINE HCL IN NACL 400 MCG/100ML IV SOLN
0.0000 ug/kg/h | INTRAVENOUS | Status: AC
  Administered 2017-11-05: 1 ug/kg/h via INTRAVENOUS
  Filled 2017-11-05: qty 100

## 2017-11-05 MED ORDER — PHENYLEPHRINE HCL 10 MG/ML IJ SOLN
0.0000 ug/min | INTRAMUSCULAR | Status: DC
  Administered 2017-11-05: 20 ug/min via INTRAVENOUS
  Administered 2017-11-06: 100 ug/min via INTRAVENOUS
  Filled 2017-11-05 (×4): qty 1

## 2017-11-05 MED ORDER — DEXMEDETOMIDINE HCL IN NACL 400 MCG/100ML IV SOLN
0.0000 ug/kg/h | INTRAVENOUS | Status: DC
  Filled 2017-11-05: qty 100

## 2017-11-05 MED ORDER — ORAL CARE MOUTH RINSE
15.0000 mL | OROMUCOSAL | Status: DC
  Administered 2017-11-05 – 2017-11-06 (×14): 15 mL via OROMUCOSAL

## 2017-11-05 MED ORDER — CHLORHEXIDINE GLUCONATE 0.12% ORAL RINSE (MEDLINE KIT)
15.0000 mL | Freq: Two times a day (BID) | OROMUCOSAL | Status: DC
  Administered 2017-11-05 – 2017-11-07 (×5): 15 mL via OROMUCOSAL

## 2017-11-05 NOTE — Progress Notes (Signed)
RT called to pt's room due to desat.  Upon arrival, RN at bedside using ambu bag to ventilate patient.  Rt suctioned trach, small yellow/tan secretions.  RT attempted to place pt back on ATC, but pt desated to 68%. RT used ambu bag to ventilate pt to sat of 100%. Then placed pt on full vent support with FIO2 of 100%.

## 2017-11-05 NOTE — Progress Notes (Signed)
Follow up - Trauma and Critical Care  Patient Details:    [name] is an 47 y.o. male.  Lines/tubes : PICC Double Lumen 11/19/2017 PICC Right Basilic 43 cm 0 cm (Active)  Indication for Insertion or Continuance of Line Prolonged intravenous therapies 11/26/2017  8:00 PM  Exposed Catheter (cm) 0 cm 11/19/2017  9:00 AM  Site Assessment Clean;Dry;Intact 11/21/2017  8:00 PM  Lumen #1 Status Flushed;Blood return noted;Infusing 11/05/2017  8:00 PM  Lumen #2 Status Flushed;Blood return noted;In-line blood sampling system in place;Infusing 11/17/2017  8:00 PM  Dressing Type Transparent;Occlusive 11/27/2017  8:00 PM  Dressing Status Clean;Dry;Intact;Antimicrobial disc in place 11/16/2017  8:00 PM  Line Care Lumen 1 tubing changed 11/04/2017  4:00 AM  Dressing Intervention New dressing 10/28/2017  9:00 AM  Dressing Change Due 11/10/17 11/04/2017  8:00 PM     Gastrostomy/Enterostomy Percutaneous endoscopic gastrostomy (PEG) 24 Fr. LUQ (Active)  Surrounding Skin Dry;Intact 11/13/2017  8:00 PM  Tube Status Patent 11/27/2017  8:00 PM  Dressing Status Clean;Dry;Intact 11/15/2017  8:00 PM  Dressing Intervention New dressing 11/22/2017  8:00 PM  Dressing Type Split gauze;Abdominal Binder 11/22/2017  8:00 PM     Urethral Catheter Jessica Ferrainolo Temperature probe 16 Fr. (Active)  Indication for Insertion or Continuance of Catheter Other (comment) 11/08/2017  8:00 PM  Site Assessment Clean;Dry;Intact 11/12/2017  8:00 PM  Catheter Maintenance Bag below level of bladder;Catheter secured;Drainage bag/tubing not touching floor;Insertion date on drainage bag;No dependent loops;Seal intact 11/16/2017  8:00 PM  Collection Container Standard drainage bag 11/14/2017  8:00 PM  Securement Method Securing device (Describe) 11/01/2017  8:00 PM  Urinary Catheter Interventions Unclamped 11/14/2017  8:00 PM  Output (mL) 125 mL 11/04/2017  7:00 AM    Microbiology/Sepsis markers: Results for orders placed or performed during the hospital encounter of  10/13/2017  Culture, respiratory (NON-Expectorated)     Status: None   Collection Time: 10/28/17  8:12 AM  Result Value Ref Range Status   Specimen Description TRACHEAL ASPIRATE  Final   Special Requests NONE  Final   Gram Stain   Final    MODERATE WBC PRESENT, PREDOMINANTLY PMN NO SQUAMOUS EPITHELIAL CELLS SEEN RARE GRAM POSITIVE COCCI IN PAIRS    Culture   Final    Consistent with normal respiratory flora. Performed at [facility], [address] [place], [place] [address]    Report Status 10/30/2017 FINAL  Final  Culture, Urine     Status: None   Collection Time: 10/28/17  8:12 AM  Result Value Ref Range Status   Specimen Description URINE, CATHETERIZED  Final   Special Requests NONE  Final   Culture   Final    NO GROWTH Performed at [facility], [address], [place], [place] [address]    Report Status 10/29/2017 FINAL  Final  Culture, blood (Routine X 2) w Reflex to ID Panel     Status: None   Collection Time: 10/28/17 10:52 AM  Result Value Ref Range Status   Specimen Description BLOOD LEFT HAND  Final   Special Requests   Final    BOTTLES DRAWN AEROBIC AND ANAEROBIC Blood Culture adequate volume   Culture   Final    NO GROWTH 5 DAYS Performed at [facility], [address] [place], [place] [address]    Report Status 11/02/2017 FINAL  Final  Culture, blood (Routine X 2) w Reflex to ID Panel     Status: None   Collection  Time: 10/28/17 11:08 AM  Result Value Ref Range Status   Specimen Description BLOOD LEFT HAND  Final   Special Requests IN PEDIATRIC BOTTLE Blood Culture adequate volume  Final   Culture   Final    NO GROWTH 5 DAYS Performed at [facility], [address], [place], [place] [address]    Report Status 11/02/2017 FINAL  Final    Anti-infectives:  Anti-infectives (From admission, onward)   Start     Dose/Rate Route Frequency Ordered Stop   11/01/17 1030  vancomycin (VANCOCIN) IVPB 1000 mg/200 mL premix  Status:   Discontinued     1,000 mg 200 mL/hr over 60 Minutes Intravenous 2 times daily 11/01/17 1016 11/02/17 0939   10/28/17 2200  vancomycin (VANCOCIN) IVPB 750 mg/150 ml premix  Status:  Discontinued     750 mg 150 mL/hr over 60 Minutes Intravenous Every 12 hours 10/28/17 0812 11/01/17 1016   10/28/17 0900  ceFEPIme (MAXIPIME) 2 g in sodium chloride 0.9 % 100 mL IVPB  Status:  Discontinued     2 g 200 mL/hr over 30 Minutes Intravenous Every 12 hours 10/28/17 0813 11/02/17 0939   10/28/17 0900  vancomycin (VANCOCIN) 2,000 mg in sodium chloride 0.9 % 500 mL IVPB     2,000 mg 250 mL/hr over 120 Minutes Intravenous  Once 10/28/17 0825 10/28/17 1233   10/28/17 0815  Vancomycin HCl in Dextrose 2-5 GM/500ML-% SOLN 2,000 mg  Status:  Discontinued     2,000 mg Intravenous  Once 10/28/17 0812 10/28/17 0824   10/28/17 0800  ceFEPIme (MAXIPIME) 2 g in sodium chloride 0.9 % 100 mL IVPB  Status:  Discontinued     2 g 200 mL/hr over 30 Minutes Intravenous Every 24 hours 10/28/17 0755 10/28/17 0813   10/15/2017 1700  ceFAZolin (ANCEF) IVPB 2g/100 mL premix     2 g 200 mL/hr over 30 Minutes Intravenous Every 8 hours 10/12/2017 1127 10/27/17 0634   10/17/2017 1016  vancomycin (VANCOCIN) powder  Status:  Discontinued       As needed 10/19/2017 1016 10/17/2017 1029   10/20/2017 0900  ceFAZolin (ANCEF) 3 g in dextrose 5 % 50 mL IVPB     3 g 130 mL/hr over 30 Minutes Intravenous  Once 10/03/2017 0856 10/04/2017 0850   10/24/17 1100  ceFAZolin (ANCEF) IVPB 2g/100 mL premix     2 g 200 mL/hr over 30 Minutes Intravenous On call to O.R. 10/24/17 1007 10/25/17 0559      Best Practice/Protocols:  VTE Prophylaxis: Lovenox (prophylaxtic dose) and Mechanical GI Prophylaxis: Proton Pump Inhibitor Propfol  Consults: Treatment Team:  Roby Lofts, MD Tressie Stalker, MD Coletta Memos, MD    Events:  Subjective:    Overnight Issues: No acute change. Dyssynchronous with vent and hypertensive this AM. No response to  painful stimuli.  Objective:  Vital signs for last 24 hours: Temp:  [98.1 F (36.7 C)-101.7 F (38.7 C)] 101.7 F (38.7 C) (03/09 0800) Pulse Rate:  [48-101] 62 (03/09 0800) Resp:  [11-34] 11 (03/09 0800) BP: (113-200)/(61-96) 200/95 (03/09 0800) SpO2:  [89 %-100 %] 93 % (03/09 0800) FiO2 (%):  [30 %] 30 % (03/09 0700)  Hemodynamic parameters for last 24 hours:    Intake/Output from previous day: 03/08 0701 - 03/09 0700 In: 3703.5 [I.V.:2103.5; NG/GT:1400] Out: 3950 [Urine:3950]  Intake/Output this shift: Total I/O In: 129.4 [I.V.:104.4; NG/GT:25] Out: 300 [Urine:300]  Vent settings for last 24 hours: Vent Mode: PRVC FiO2 (%):  [30 %]  30 % Set Rate:  [18 bmp] 18 bmp Vt Set:  [161[620 mL] 620 mL PEEP:  [5 cmH20] 5 cmH20 Pressure Support:  [8 cmH20] 8 cmH20  Physical Exam:  General: no respiratory distress Neuro: RASS -2 and No responsiveness. HEENT/Neck: trach-clean, intact Resp: clear to auscultation bilaterally CVS: regular rate and rhythm, S1, S2 normal, no murmur, click, rub or gallop GI: Soft, tolerating tube feedings well Extremities: edema 3+  Results for orders placed or performed during the hospital encounter of 10/17/2017 (from the past 24 hour(s))  CBC with Differential/Platelet     Status: Abnormal   Collection Time: 11/05/17  5:14 AM  Result Value Ref Range   WBC 11.8 (H) 4.0 - 10.5 K/uL   RBC 3.02 (L) 4.22 - 5.81 MIL/uL   Hemoglobin 9.5 (L) 13.0 - 17.0 g/dL   HCT 09.630.0 (L) 04.539.0 - 40.952.0 %   MCV 99.3 78.0 - 100.0 fL   MCH 31.5 26.0 - 34.0 pg   MCHC 31.7 30.0 - 36.0 g/dL   RDW 81.117.7 (H) 91.411.5 - 78.215.5 %   Platelets 287 150 - 400 K/uL   Neutrophils Relative % 77 %   Neutro Abs 9.0 (H) 1.7 - 7.7 K/uL   Lymphocytes Relative 15 %   Lymphs Abs 1.8 0.7 - 4.0 K/uL   Monocytes Relative 5 %   Monocytes Absolute 0.6 0.1 - 1.0 K/uL   Eosinophils Relative 3 %   Eosinophils Absolute 0.3 0.0 - 0.7 K/uL   Basophils Relative 0 %   Basophils Absolute 0.0 0.0 - 0.1 K/uL   Comprehensive metabolic panel     Status: Abnormal   Collection Time: 11/05/17  5:14 AM  Result Value Ref Range   Sodium 134 (L) 135 - 145 mmol/L   Potassium 4.0 3.5 - 5.1 mmol/L   Chloride 105 101 - 111 mmol/L   CO2 18 (L) 22 - 32 mmol/L   Glucose, Bld 155 (H) 65 - 99 mg/dL   BUN 31 (H) 6 - 20 mg/dL   Creatinine, Ser 9.560.76 0.61 - 1.24 mg/dL   Calcium 8.0 (L) 8.9 - 10.3 mg/dL   Total Protein 6.8 6.5 - 8.1 g/dL   Albumin 2.4 (L) 3.5 - 5.0 g/dL   AST 82 (H) 15 - 41 U/L   ALT 40 17 - 63 U/L   Alkaline Phosphatase 93 38 - 126 U/L   Total Bilirubin 1.2 0.3 - 1.2 mg/dL   GFR calc non Af Amer >60 >60 mL/min   GFR calc Af Amer >60 >60 mL/min   Anion gap 11 5 - 15     Assessment/Plan:   NEURO  Altered Mental Status:  obtundation and coma   Plan: on Precedex- try to wean  PULM  CXR pending, but at the bedside, he seems to be okay   Plan: Will switch to SIMV see if that helps with dyssynhcrony. Wean to trach collar if possible  CARDIO  No cardiac issues   Plan: CPM. PRN meds for HTN  RENAL  Urine output is good.   Plan: Hypernatremia resolved. Stop 0.45%NS. Continue free water flushes for now. Check labs tomorrow.   GI  New PEG in place and functioning well.   Plan: Continue tube feedings.  ID  No known new infectious source   Plan: CPM  HEME  Anemia acute blood loss anemia and anemia of critical illness)   Plan: Has not needed transfusion  ENDO No specific issues   Plan: CPM  Global Issues  Swtich to SIMV, try to wean to trach collar Wean off sedation Stop IVF   LOS: 14 days   Additional comments:I have discussed and reviewed with family members patient's Partner  Critical Care Total Time*: 30 Minutes  Erskin Zinda A Hermena Swint 11/05/2017  *Care during the described time interval was provided by me and/or other providers on the critical care team.  I have reviewed this patient's available data, including medical history, events of note, physical examination and test results  as part of my evaluation.

## 2017-11-05 NOTE — Progress Notes (Signed)
RT suctioned pt's trach.  [name] HR dropped to mid 20's.  HR returned to 42 after a few moments.  RN notified.

## 2017-11-05 NOTE — Progress Notes (Signed)
CT reviewed with Dr. Harrie JeansStratton, appreciate Dr. 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 I updated his partner at the bedside regarding the worsened clinical status and CT findings. Continue supportive care at this time.

## 2017-11-05 NOTE — Progress Notes (Addendum)
Pt w emesis a few hours ago, peg to gravity- abdomen remains soft, nondistended  Pt w acute desat this hour- improved with bagging and now back on vent, sat 100%. Equal breath sounds bilaterally, a little rhonchorus  Still brady but better- hr mid 50s and SBP 130-140  -Check CXR -Continue full vent support overnight, wean fiO2 as able for sat >95   Addendum 9:30pm- pt acutely hypotensive and mildly tachycardic. Receiving fluid bolus. RN notes that he has lost cough/gag/corneals. Will repeat CT head.

## 2017-11-05 NOTE — Progress Notes (Signed)
@   2100 pt hypotensive 79/53 and now HR 90-100s. An hour prior at 2000 patient was SB 40s-50s and BP WNL.  Called Dr Fredricka Bonineonnor, order received for 2L NS bolus.    Called Dr Fredricka Bonineonnor back at 2130 to inform her patient no longer has cough, gag, corneals. GCS 3.  @ 2000 pt still had a strong cough when tracheal suctioned and now cough absent and not breathing over vent. Order received for stat head CT when pt stabilizes and can start neo if remains hypotensive. Will continue to monitor.

## 2017-11-05 NOTE — Progress Notes (Signed)
Pt had 1 episode emesis. Stopped tube feedings. Notified Dr. Fredricka Bonineonnor, verbal for PEG to gravity for drainage. Suctioned trach and mouth. Will continue to closely monitor pt.

## 2017-11-05 NOTE — Progress Notes (Addendum)
Pt w bradycardia 30-40s starting mid-morning today associated with mild hypotension (102/48). Last labetolol was around 0830. Precedex had been increased early this AM- had been 0.4-0.5 but was bumped up to 1 around 0600. I suspect this is driving the change bp/hr. Sat 98% on trach collar which he has been on since this AM. RR 11-13. Temp now 36. Had fever earlier was on cooling blanket, now off. Elytes (K) ok this am. Will check EKG, ABG and troponin to r/o acidosis/MI. Precedex has been turned off.   EKG- sinus bradycardia, QTc 480; Normal PR interval, No ST elevation  ABG 7.37/ 34/ 98, bicarb 19.9

## 2017-11-05 NOTE — Progress Notes (Signed)
Pt desat to 2270s, RN at bedside. Pt suddenly desat to 18%, bagged pt. Called RT and Dr. Fredricka Bonineonnor. Pt placed back on vent and Dr. Fredricka Bonineonnor en route to evaluate pt. See assoc orders.

## 2017-11-05 NOTE — Progress Notes (Signed)
Patient ID: [name], male   DOB: 12/12/1970, 47 y.o.   MRN: [id number] CT head reviewed - basal brain shows ground glass appearance, almond shaped brainstem with complete effacement of basal cisterns, large R hemispheric ICH largely unchanged, no HCP. 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

## 2017-11-05 NOTE — Progress Notes (Signed)
Patient ID: Jeremiah RosenthalSky Mathews, male   DOB: 02/26/1971, 47 y.o.   MRN: [id number] Comatose, trached, no change.

## 2017-11-06 ENCOUNTER — Other Ambulatory Visit (HOSPITAL_COMMUNITY): Payer: Medicare HMO

## 2017-11-06 ENCOUNTER — Inpatient Hospital Stay (HOSPITAL_COMMUNITY): Payer: Medicare HMO

## 2017-11-06 ENCOUNTER — Encounter (HOSPITAL_COMMUNITY): Payer: Self-pay | Admitting: *Deleted

## 2017-11-06 LAB — POCT I-STAT 3, ART BLOOD GAS (G3+)
ACID-BASE DEFICIT: 4 mmol/L — AB (ref 0.0–2.0)
Bicarbonate: 21.9 mmol/L (ref 20.0–28.0)
O2 SAT: 100 %
PH ART: 7.31 — AB (ref 7.350–7.450)
PO2 ART: 302 mmHg — AB (ref 83.0–108.0)
TCO2: 23 mmol/L (ref 22–32)
pCO2 arterial: 43.5 mmHg (ref 32.0–48.0)

## 2017-11-06 LAB — COMPREHENSIVE METABOLIC PANEL
ALK PHOS: 97 U/L (ref 38–126)
ALT: 62 U/L (ref 17–63)
AST: 166 U/L — ABNORMAL HIGH (ref 15–41)
Albumin: 2.2 g/dL — ABNORMAL LOW (ref 3.5–5.0)
Anion gap: 7 (ref 5–15)
BUN: 40 mg/dL — ABNORMAL HIGH (ref 6–20)
CALCIUM: 8.5 mg/dL — AB (ref 8.9–10.3)
CHLORIDE: 122 mmol/L — AB (ref 101–111)
CO2: 20 mmol/L — AB (ref 22–32)
CREATININE: 1.24 mg/dL (ref 0.61–1.24)
Glucose, Bld: 82 mg/dL (ref 65–99)
Potassium: 3.5 mmol/L (ref 3.5–5.1)
SODIUM: 149 mmol/L — AB (ref 135–145)
Total Bilirubin: 3.5 mg/dL — ABNORMAL HIGH (ref 0.3–1.2)
Total Protein: 6.3 g/dL — ABNORMAL LOW (ref 6.5–8.1)

## 2017-11-06 LAB — CBC
HCT: 30.2 % — ABNORMAL LOW (ref 39.0–52.0)
HEMATOCRIT: 30.9 % — AB (ref 39.0–52.0)
Hemoglobin: 9.4 g/dL — ABNORMAL LOW (ref 13.0–17.0)
Hemoglobin: 9.4 g/dL — ABNORMAL LOW (ref 13.0–17.0)
MCH: 32.1 pg (ref 26.0–34.0)
MCH: 31.8 pg (ref 26.0–34.0)
MCHC: 31.1 g/dL (ref 30.0–36.0)
MCHC: 30.4 g/dL (ref 30.0–36.0)
MCV: 103.1 fL — AB (ref 78.0–100.0)
MCV: 104.4 fL — AB (ref 78.0–100.0)
PLATELETS: 404 10*3/uL — AB (ref 150–400)
Platelets: 405 10*3/uL — ABNORMAL HIGH (ref 150–400)
RBC: 2.93 MIL/uL — ABNORMAL LOW (ref 4.22–5.81)
RBC: 2.96 MIL/uL — AB (ref 4.22–5.81)
RDW: 18.6 % — ABNORMAL HIGH (ref 11.5–15.5)
RDW: 19 % — ABNORMAL HIGH (ref 11.5–15.5)
WBC: 18.2 10*3/uL — ABNORMAL HIGH (ref 4.0–10.5)
WBC: 17.3 10*3/uL — AB (ref 4.0–10.5)

## 2017-11-06 LAB — BASIC METABOLIC PANEL
Anion gap: 7 (ref 5–15)
BUN: 38 mg/dL — AB (ref 6–20)
CO2: 21 mmol/L — ABNORMAL LOW (ref 22–32)
CREATININE: 0.98 mg/dL (ref 0.61–1.24)
Calcium: 8.1 mg/dL — ABNORMAL LOW (ref 8.9–10.3)
Chloride: 119 mmol/L — ABNORMAL HIGH (ref 101–111)
GFR calc Af Amer: >60 mL/min (ref >60)
GLUCOSE: 80 mg/dL (ref 65–99)
Potassium: 2.8 mmol/L — ABNORMAL LOW (ref 3.5–5.1)
SODIUM: 147 mmol/L — AB (ref 135–145)

## 2017-11-06 LAB — TROPONIN I: TROPONIN I: 0.09 ng/mL — AB (ref <0.03)

## 2017-11-06 LAB — DIFFERENTIAL
Basophils Absolute: 0.1 10*3/uL (ref 0.0–0.1)
Basophils Relative: 0 %
Eosinophils Absolute: 1.1 10*3/uL — ABNORMAL HIGH (ref 0.0–0.7)
Eosinophils Relative: 6 %
LYMPHS PCT: 24 %
Lymphs Abs: 4.1 10*3/uL — ABNORMAL HIGH (ref 0.7–4.0)
MONO ABS: 1.4 10*3/uL — AB (ref 0.1–1.0)
MONOS PCT: 8 %
NEUTROS ABS: 10.7 10*3/uL — AB (ref 1.7–7.7)
Neutrophils Relative %: 62 %

## 2017-11-06 LAB — HEMOGLOBIN A1C
HEMOGLOBIN A1C: 4.6 % — AB (ref 4.8–5.6)
MEAN PLASMA GLUCOSE: 85.32 mg/dL

## 2017-11-06 LAB — APTT: aPTT: 35 seconds (ref 24–36)

## 2017-11-06 LAB — LIPASE, BLOOD: Lipase: 62 U/L — ABNORMAL HIGH (ref 11–51)

## 2017-11-06 LAB — TYPE AND SCREEN
ABO/RH(D): A POS
Antibody Screen: NEGATIVE

## 2017-11-06 LAB — URINALYSIS, ROUTINE W REFLEX MICROSCOPIC
BILIRUBIN URINE: NEGATIVE
Glucose, UA: NEGATIVE mg/dL
HGB URINE DIPSTICK: NEGATIVE
KETONES UR: NEGATIVE mg/dL
Leukocytes, UA: NEGATIVE
NITRITE: NEGATIVE
PROTEIN: NEGATIVE mg/dL
SPECIFIC GRAVITY, URINE: 1.009 (ref 1.005–1.030)
pH: 5 (ref 5.0–8.0)

## 2017-11-06 LAB — PHOSPHORUS: Phosphorus: 3.7 mg/dL (ref 2.5–4.6)

## 2017-11-06 LAB — BLOOD GAS, ARTERIAL
Acid-base deficit: 5.8 mmol/L — ABNORMAL HIGH (ref 0.0–2.0)
Bicarbonate: 18.8 mmol/L — ABNORMAL LOW (ref 20.0–28.0)
DRAWN BY: 418751
FIO2: 40
O2 SAT: 93.3 %
PCO2 ART: 35.6 mmHg (ref 32.0–48.0)
PEEP: 5 cmH2O
PH ART: 7.344 — AB (ref 7.350–7.450)
Patient temperature: 98.6
RATE: 18 resp/min
VT: 620 mL
pO2, Arterial: 71.6 mmHg — ABNORMAL LOW (ref 83.0–108.0)

## 2017-11-06 LAB — GLUCOSE, CAPILLARY: GLUCOSE-CAPILLARY: 78 mg/dL (ref 65–99)

## 2017-11-06 LAB — CK TOTAL AND CKMB (NOT AT ARMC)
CK, MB: 12.8 ng/mL — AB (ref 0.5–5.0)
Relative Index: 8.1 — ABNORMAL HIGH (ref 0.0–2.5)
Total CK: 158 U/L (ref 49–397)

## 2017-11-06 LAB — PROTIME-INR
INR: 1.09
PROTHROMBIN TIME: 14 s (ref 11.4–15.2)

## 2017-11-06 LAB — LACTATE DEHYDROGENASE: LDH: 269 U/L — ABNORMAL HIGH (ref 98–192)

## 2017-11-06 LAB — AMYLASE: AMYLASE: 114 U/L — AB (ref 28–100)

## 2017-11-06 LAB — FIBRINOGEN: FIBRINOGEN: 560 mg/dL — AB (ref 210–475)

## 2017-11-06 LAB — BILIRUBIN, DIRECT: BILIRUBIN DIRECT: 2.2 mg/dL — AB (ref 0.1–0.5)

## 2017-11-06 LAB — MAGNESIUM
MAGNESIUM: 2.7 mg/dL — AB (ref 1.7–2.4)
MAGNESIUM: 2.7 mg/dL — AB (ref 1.7–2.4)

## 2017-11-06 LAB — ECHOCARDIOGRAM COMPLETE
HEIGHTINCHES: 72 in
WEIGHTICAEL: 4141.12 [oz_av]

## 2017-11-06 MED ORDER — IPRATROPIUM-ALBUTEROL 0.5-2.5 (3) MG/3ML IN SOLN
3.0000 mL | RESPIRATORY_TRACT | Status: DC
  Administered 2017-11-06 – 2017-11-07 (×7): 3 mL via RESPIRATORY_TRACT
  Filled 2017-11-06 (×7): qty 3

## 2017-11-06 MED ORDER — VASOPRESSIN 20 UNIT/ML IV SOLN
0.5000 [IU]/h | INTRAVENOUS | Status: DC
  Administered 2017-11-06: 0.5 [IU]/h via INTRAVENOUS
  Filled 2017-11-06 (×2): qty 1

## 2017-11-06 MED ORDER — TECHNETIUM TC 99M EXAMETAZIME IV KIT
20.4000 | PACK | Freq: Once | INTRAVENOUS | Status: AC | PRN
  Administered 2017-11-06: 20.4 via INTRAVENOUS

## 2017-11-06 MED ORDER — IPRATROPIUM-ALBUTEROL 0.5-2.5 (3) MG/3ML IN SOLN
3.0000 mL | RESPIRATORY_TRACT | Status: DC | PRN
  Filled 2017-11-06: qty 3

## 2017-11-06 MED ORDER — SODIUM CHLORIDE 0.9 % IV SOLN
1000.0000 mg | Freq: Once | INTRAVENOUS | Status: AC
  Administered 2017-11-06: 1000 mg via INTRAVENOUS
  Filled 2017-11-06 (×3): qty 8

## 2017-11-06 MED ORDER — POTASSIUM CHLORIDE 10 MEQ/100ML IV SOLN
10.0000 meq | INTRAVENOUS | Status: DC
  Administered 2017-11-06 (×4): 10 meq via INTRAVENOUS
  Filled 2017-11-06 (×3): qty 100

## 2017-11-06 MED ORDER — DEXTROSE 50 % IV SOLN
50.0000 mL | Freq: Once | INTRAVENOUS | Status: AC
  Administered 2017-11-06: 50 mL via INTRAVENOUS
  Filled 2017-11-06: qty 50

## 2017-11-06 MED ORDER — VANCOMYCIN HCL 10 G IV SOLR
1500.0000 mg | INTRAVENOUS | Status: AC
  Administered 2017-11-06: 1500 mg via INTRAVENOUS
  Filled 2017-11-06: qty 1500

## 2017-11-06 MED ORDER — LEVOTHYROXINE SODIUM 100 MCG IV SOLR
20.0000 ug | Freq: Once | INTRAVENOUS | Status: AC
  Administered 2017-11-06: 20 ug via INTRAVENOUS
  Filled 2017-11-06: qty 5

## 2017-11-06 MED ORDER — INSULIN ASPART 100 UNIT/ML ~~LOC~~ SOLN
20.0000 [IU] | Freq: Once | SUBCUTANEOUS | Status: AC
  Administered 2017-11-06: 20 [IU] via SUBCUTANEOUS

## 2017-11-06 MED ORDER — SODIUM CHLORIDE 0.45 % IV SOLN
INTRAVENOUS | Status: DC
  Administered 2017-11-06 – 2017-11-07 (×3): via INTRAVENOUS

## 2017-11-06 MED ORDER — SODIUM CHLORIDE 0.9 % IV SOLN
INTRAVENOUS | Status: DC | PRN
  Administered 2017-11-07 (×2): via INTRA_ARTERIAL

## 2017-11-06 MED ORDER — SODIUM CHLORIDE 0.9 % IV SOLN
10.0000 ug/h | INTRAVENOUS | Status: DC
  Administered 2017-11-06: 10 ug/h via INTRAVENOUS
  Administered 2017-11-07 (×2): 20 ug/h via INTRAVENOUS
  Filled 2017-11-06 (×3): qty 10

## 2017-11-06 MED ORDER — SODIUM CHLORIDE 0.9 % IV SOLN
1000.0000 mg | INTRAVENOUS | Status: AC
  Administered 2017-11-06: 1000 mg via INTRAVENOUS
  Filled 2017-11-06: qty 8

## 2017-11-06 MED ORDER — POTASSIUM CHLORIDE 10 MEQ/100ML IV SOLN
INTRAVENOUS | Status: AC
  Administered 2017-11-06: 10 meq via INTRAVENOUS
  Filled 2017-11-06: qty 100

## 2017-11-06 MED ORDER — SODIUM CHLORIDE 0.9 % IV SOLN
0.0000 ug/min | INTRAVENOUS | Status: DC
  Administered 2017-11-06: 115 ug/min via INTRAVENOUS
  Administered 2017-11-06: 225 ug/min via INTRAVENOUS
  Administered 2017-11-06 (×2): 130 ug/min via INTRAVENOUS
  Administered 2017-11-07: 65 ug/min via INTRAVENOUS
  Administered 2017-11-07: 105 ug/min via INTRAVENOUS
  Filled 2017-11-06: qty 40
  Filled 2017-11-06: qty 4
  Filled 2017-11-06: qty 40
  Filled 2017-11-06 (×2): qty 4
  Filled 2017-11-06 (×2): qty 40

## 2017-11-06 NOTE — Progress Notes (Signed)
Pt transported to radiology via ventilator. Pt remained stable throughout the transport. Pt returned to 4N22. No complications noted.

## 2017-11-06 NOTE — Procedures (Signed)
Bronchoscopy Procedure Note Jeremiah RosenthalSky Mathews [id number] 11/03/1970  Procedure: Bronchoscopy Indications: Obtain specimens for culture and/or other diagnostic studies, Remove secretions and Surveillance for donor service  Procedure Details Consent: Consent per CDS protocol Time Out: Verified patient identification, verified procedure, site/side was marked, verified correct patient position, special equipment/implants available, medications/allergies/relevent history reviewed, required imaging and test results available.  Performed  In preparation for procedure, patient was given 100% FiO2 and bronchoscope lubricated. Sedation: No medications given  Airway entered and the following bronchi were examined: RUL, RML, RLL, LUL, LLL and Bronchi.   Procedures performed: Brushings performed Bronchoscope removed.  , FIO2 reduced to the level he was on prior to the procedure  Evaluation Hemodynamic Status: BP stable throughout; O2 sats: stable throughout [name] Current Condition: stable Specimens:  Sent purulent fluid Complications: No apparent complications Patient did tolerate procedure well.   Examined the RUL, RML and the RLL.  All bronchi  On the right were not inflamed and there were no secretions.  Examined the LUL and LLL and there were tan secretions that were aspirated and sent as a specimen from the left side.  They were moderately thick and lavaged w ith 20cc of saline.  [name] 11/17/2017
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
## 2017-11-06 NOTE — Progress Notes (Signed)
New sputum collected and sent to lab

## 2017-11-06 NOTE — Progress Notes (Signed)
  Echocardiogram 2D Echocardiogram has been performed.  Jeremiah BasemanReel, [name] M 10/29/2017, 8:15 PM

## 2017-11-06 NOTE — Progress Notes (Signed)
CBF study demonstrates no flow to the brain.  The patientis clinically and radilogically brain dead.  Time of death 1455.  Jeremiah LamasJames O. Gae BonWyatt, III, MD, FACS [phone] Trauma Surgeon

## 2017-11-06 NOTE — Progress Notes (Signed)
Recruitment maneuver preformed at this time. Pt tolerated well, RN aware

## 2017-11-06 NOTE — Progress Notes (Signed)
Patient ID: [name], male   DOB: 07/28/1971, 47 y.o.   MRN: [id number] No corneals, no gag, pupils mid position and fixed, no response to deep noxious stimuli. Consistent with herniation syndrome. Recommend cerebral blood flow study

## 2017-11-06 NOTE — Procedures (Signed)
Arterial Catheter Insertion Procedure Note Jeremiah RosenthalSky Mathews [id number] 01/22/1971  Procedure: Insertion of Arterial Catheter  Indications: Blood pressure monitoring and Frequent blood sampling  Procedure Details Consent: Risks of procedure as well as the alternatives and risks of each were explained to the (patient/caregiver).  Consent for procedure obtained. Time Out: Verified patient identification, verified procedure, site/side was marked, verified correct patient position, special equipment/implants available, medications/allergies/relevent history reviewed, required imaging and test results available.  Performed  Maximum sterile technique was used including antiseptics, cap, gloves, gown, hand hygiene, mask and sheet. Skin prep: Chlorhexidine; local anesthetic administered 20 gauge catheter was inserted into left radial artery using the Seldinger technique.  Evaluation Blood flow good; BP tracing good. Complications: No apparent complications.   [name], [name] [name] 11/11/2017

## 2017-11-07 ENCOUNTER — Inpatient Hospital Stay (HOSPITAL_COMMUNITY): Payer: Medicare HMO | Admitting: Anesthesiology

## 2017-11-07 ENCOUNTER — Inpatient Hospital Stay (HOSPITAL_COMMUNITY): Admission: EM | Disposition: E | Payer: Self-pay | Source: Home / Self Care

## 2017-11-07 ENCOUNTER — Encounter (HOSPITAL_COMMUNITY): Payer: Self-pay | Admitting: Cardiovascular Disease

## 2017-11-07 ENCOUNTER — Encounter (HOSPITAL_COMMUNITY): Admission: EM | Disposition: E | Payer: Self-pay | Source: Home / Self Care

## 2017-11-07 ENCOUNTER — Encounter (HOSPITAL_COMMUNITY): Payer: Self-pay | Admitting: *Deleted

## 2017-11-07 ENCOUNTER — Encounter (HOSPITAL_COMMUNITY): Payer: Medicare HMO | Admitting: Anesthesiology

## 2017-11-07 DIAGNOSIS — Z529 Donor of unspecified organ or tissue: Secondary | ICD-10-CM

## 2017-11-07 HISTORY — PX: ORGAN PROCUREMENT: SHX5270

## 2017-11-07 HISTORY — PX: RIGHT/LEFT HEART CATH AND CORONARY ANGIOGRAPHY: CATH118266

## 2017-11-07 LAB — CBC
HCT: 23.2 % — ABNORMAL LOW (ref 39.0–52.0)
HEMATOCRIT: 25.9 % — AB (ref 39.0–52.0)
HEMOGLOBIN: 8 g/dL — AB (ref 13.0–17.0)
HEMOGLOBIN: 7.1 g/dL — AB (ref 13.0–17.0)
MCH: 31.7 pg (ref 26.0–34.0)
MCH: 32 pg (ref 26.0–34.0)
MCHC: 30.9 g/dL (ref 30.0–36.0)
MCHC: 30.6 g/dL (ref 30.0–36.0)
MCV: 102.8 fL — AB (ref 78.0–100.0)
MCV: 104.5 fL — AB (ref 78.0–100.0)
Platelets: 237 10*3/uL (ref 150–400)
Platelets: 301 10*3/uL (ref 150–400)
RBC: 2.52 MIL/uL — ABNORMAL LOW (ref 4.22–5.81)
RBC: 2.22 MIL/uL — ABNORMAL LOW (ref 4.22–5.81)
RDW: 18.7 % — AB (ref 11.5–15.5)
RDW: 19.7 % — ABNORMAL HIGH (ref 11.5–15.5)
WBC: 8.9 10*3/uL (ref 4.0–10.5)
WBC: 10.6 10*3/uL — AB (ref 4.0–10.5)

## 2017-11-07 LAB — BLOOD GAS, ARTERIAL
Acid-base deficit: 6.3 mmol/L — ABNORMAL HIGH (ref 0.0–2.0)
BICARBONATE: 18.4 mmol/L — AB (ref 20.0–28.0)
FIO2: 100
LHR: 18 {breaths}/min
MECHVT: 620 mL
O2 Saturation: 99.6 %
PEEP/CPAP: 5 cmH2O
PO2 ART: 278 mmHg — AB (ref 83.0–108.0)
Patient temperature: 98.6
pCO2 arterial: 35.4 mmHg (ref 32.0–48.0)
pH, Arterial: 7.336 — ABNORMAL LOW (ref 7.350–7.450)

## 2017-11-07 LAB — URINALYSIS, ROUTINE W REFLEX MICROSCOPIC
Bilirubin Urine: NEGATIVE
Glucose, UA: NEGATIVE mg/dL
HGB URINE DIPSTICK: NEGATIVE
Ketones, ur: NEGATIVE mg/dL
LEUKOCYTES UA: NEGATIVE
Nitrite: NEGATIVE
PROTEIN: NEGATIVE mg/dL
SPECIFIC GRAVITY, URINE: 1.012 (ref 1.005–1.030)
pH: 5 (ref 5.0–8.0)

## 2017-11-07 LAB — TROPONIN I

## 2017-11-07 LAB — POCT I-STAT 3, ART BLOOD GAS (G3+)
ACID-BASE DEFICIT: 6 mmol/L — AB (ref 0.0–2.0)
Acid-base deficit: 4 mmol/L — ABNORMAL HIGH (ref 0.0–2.0)
Acid-base deficit: 2 mmol/L (ref 0.0–2.0)
Bicarbonate: 19 mmol/L — ABNORMAL LOW (ref 20.0–28.0)
Bicarbonate: 20.4 mmol/L (ref 20.0–28.0)
Bicarbonate: 22.4 mmol/L (ref 20.0–28.0)
O2 SAT: 97 %
O2 Saturation: 96 %
O2 Saturation: 100 %
PCO2 ART: 33.8 mmHg (ref 32.0–48.0)
PCO2 ART: 32.5 mmHg (ref 32.0–48.0)
PH ART: 7.363 (ref 7.350–7.450)
PH ART: 7.39 (ref 7.350–7.450)
PH ART: 7.441 (ref 7.350–7.450)
Patient temperature: 97.5
Patient temperature: 98.6
Patient temperature: 35.9
TCO2: 20 mmol/L — ABNORMAL LOW (ref 22–32)
TCO2: 21 mmol/L — ABNORMAL LOW (ref 22–32)
TCO2: 23 mmol/L (ref 22–32)
pCO2 arterial: 33.3 mmHg (ref 32.0–48.0)
pO2, Arterial: 91 mmHg (ref 83.0–108.0)
pO2, Arterial: 79 mmHg — ABNORMAL LOW (ref 83.0–108.0)
pO2, Arterial: 248 mmHg — ABNORMAL HIGH (ref 83.0–108.0)

## 2017-11-07 LAB — COMPREHENSIVE METABOLIC PANEL
ALBUMIN: 2.4 g/dL — AB (ref 3.5–5.0)
ALK PHOS: 94 U/L (ref 38–126)
ALT: 65 U/L — ABNORMAL HIGH (ref 17–63)
ALT: 56 U/L (ref 17–63)
ANION GAP: 7 (ref 5–15)
AST: 165 U/L — AB (ref 15–41)
AST: 128 U/L — ABNORMAL HIGH (ref 15–41)
Albumin: 2.2 g/dL — ABNORMAL LOW (ref 3.5–5.0)
Alkaline Phosphatase: 90 U/L (ref 38–126)
Anion gap: 11 (ref 5–15)
BILIRUBIN TOTAL: 4.2 mg/dL — AB (ref 0.3–1.2)
BUN: 36 mg/dL — ABNORMAL HIGH (ref 6–20)
BUN: 30 mg/dL — ABNORMAL HIGH (ref 6–20)
CALCIUM: 7.4 mg/dL — AB (ref 8.9–10.3)
CHLORIDE: 124 mmol/L — AB (ref 101–111)
CHLORIDE: 129 mmol/L — AB (ref 101–111)
CO2: 19 mmol/L — AB (ref 22–32)
CO2: 19 mmol/L — AB (ref 22–32)
CREATININE: 1.17 mg/dL (ref 0.61–1.24)
Calcium: 8.3 mg/dL — ABNORMAL LOW (ref 8.9–10.3)
Creatinine, Ser: 0.97 mg/dL (ref 0.61–1.24)
GFR calc Af Amer: >60 mL/min (ref >60)
Glucose, Bld: 185 mg/dL — ABNORMAL HIGH (ref 65–99)
Glucose, Bld: 179 mg/dL — ABNORMAL HIGH (ref 65–99)
Potassium: 4.5 mmol/L (ref 3.5–5.1)
Potassium: 3 mmol/L — ABNORMAL LOW (ref 3.5–5.1)
SODIUM: 155 mmol/L — AB (ref 135–145)
Sodium: 154 mmol/L — ABNORMAL HIGH (ref 135–145)
Total Bilirubin: 2.5 mg/dL — ABNORMAL HIGH (ref 0.3–1.2)
Total Protein: 6.4 g/dL — ABNORMAL LOW (ref 6.5–8.1)
Total Protein: 5.6 g/dL — ABNORMAL LOW (ref 6.5–8.1)

## 2017-11-07 LAB — URINE CULTURE: Culture: NO GROWTH

## 2017-11-07 LAB — GLUCOSE, CAPILLARY
GLUCOSE-CAPILLARY: 184 mg/dL — AB (ref 65–99)
Glucose-Capillary: 182 mg/dL — ABNORMAL HIGH (ref 65–99)

## 2017-11-07 LAB — MAGNESIUM: Magnesium: 2.6 mg/dL — ABNORMAL HIGH (ref 1.7–2.4)

## 2017-11-07 LAB — PHOSPHORUS: PHOSPHORUS: 4.8 mg/dL — AB (ref 2.5–4.6)

## 2017-11-07 SURGERY — SURGICAL PROCUREMENT, ORGAN
Anesthesia: General | Site: Chest

## 2017-11-07 SURGERY — RIGHT/LEFT HEART CATH AND CORONARY ANGIOGRAPHY
Anesthesia: LOCAL

## 2017-11-07 MED ORDER — LACTATED RINGERS IV SOLN
INTRAVENOUS | Status: DC | PRN
  Administered 2017-11-07: 20:00:00 via INTRAVENOUS

## 2017-11-07 MED ORDER — MANNITOL 25 % IV SOLN
3.0000 g/h | INTRAVENOUS | Status: DC
  Filled 2017-11-07 (×4): qty 100

## 2017-11-07 MED ORDER — POTASSIUM CHLORIDE 20 MEQ/15ML (10%) PO SOLN
40.0000 meq | Freq: Once | ORAL | Status: AC
Start: 2017-11-07 — End: 2017-11-07
  Administered 2017-11-07: 40 meq via ORAL
  Filled 2017-11-07: qty 30

## 2017-11-07 MED ORDER — DOPAMINE-DEXTROSE 3.2-5 MG/ML-% IV SOLN: 2.0000 ug/kg/min | INTRAVENOUS | Status: DC

## 2017-11-07 MED ORDER — ALBUMIN HUMAN 5 % IV SOLN
INTRAVENOUS | Status: AC
Start: 2017-11-07 — End: 2017-11-07
  Filled 2017-11-07: qty 500

## 2017-11-07 MED ORDER — PIPERACILLIN-TAZOBACTAM 3.375 G IVPB 30 MIN
3.3750 g | Freq: Once | INTRAVENOUS | Status: AC
  Administered 2017-11-07: 3.375 g via INTRAVENOUS
  Filled 2017-11-07: qty 50

## 2017-11-07 MED ORDER — ALBUMIN HUMAN 5 % IV SOLN
25.0000 g | Freq: Once | INTRAVENOUS | Status: AC
Start: 2017-11-07 — End: 2017-11-07
  Administered 2017-11-07: 25 g via INTRAVENOUS

## 2017-11-07 MED ORDER — METHYLPREDNISOLONE SODIUM SUCC 1000 MG IJ SOLR
1000.0000 mg | Freq: Once | INTRAMUSCULAR | Status: AC
  Administered 2017-11-07: 1000 mg via INTRAVENOUS
  Filled 2017-11-07: qty 8

## 2017-11-07 MED ORDER — SODIUM BICARBONATE 8.4 % IV SOLN
INTRAVENOUS | Status: AC
  Filled 2017-11-07: qty 50

## 2017-11-07 MED ORDER — HEPARIN (PORCINE) IN NACL 2-0.9 UNIT/ML-% IJ SOLN
INTRAMUSCULAR | Status: AC | PRN
  Administered 2017-11-07 (×2): 500 mL

## 2017-11-07 MED ORDER — HEPARIN SODIUM (PORCINE) 1000 UNIT/ML IJ SOLN
INTRAMUSCULAR | Status: DC | PRN
  Administered 2017-11-07: 35000 [IU] via INTRAVENOUS

## 2017-11-07 MED ORDER — ALBUMIN HUMAN 5 % IV SOLN
25.0000 g | Freq: Once | INTRAVENOUS | Status: AC
  Administered 2017-11-07: 25 g via INTRAVENOUS
  Filled 2017-11-07: qty 500

## 2017-11-07 MED ORDER — SODIUM BICARBONATE 8.4 % IV SOLN
50.0000 meq | Freq: Once | INTRAVENOUS | Status: AC
  Administered 2017-11-07: 50 meq via INTRAVENOUS
  Filled 2017-11-07: qty 50

## 2017-11-07 MED ORDER — SODIUM CHLORIDE 0.9 % IV SOLN
1.0000 g | Freq: Once | INTRAVENOUS | Status: AC
  Administered 2017-11-07: 1 g via INTRAVENOUS
  Filled 2017-11-07: qty 10

## 2017-11-07 MED ORDER — SODIUM BICARBONATE 8.4 % IV SOLN
50.0000 meq | Freq: Once | INTRAVENOUS | Status: AC
  Administered 2017-11-07: 50 meq via INTRAVENOUS

## 2017-11-07 MED ORDER — IOPAMIDOL (ISOVUE-370) INJECTION 76%
INTRAVENOUS | Status: AC
  Filled 2017-11-07: qty 100

## 2017-11-07 MED ORDER — HEPARIN (PORCINE) IN NACL 2-0.9 UNIT/ML-% IJ SOLN
INTRAMUSCULAR | Status: AC
  Filled 2017-11-07: qty 1500

## 2017-11-07 MED ORDER — IOPAMIDOL (ISOVUE-370) INJECTION 76%
INTRAVENOUS | Status: DC | PRN
  Administered 2017-11-07: 85 mL via INTRA_ARTERIAL

## 2017-11-07 MED ORDER — SODIUM CHLORIDE 0.45 % IV BOLUS
1000.0000 mL | Freq: Once | INTRAVENOUS | Status: AC
  Administered 2017-11-07: 1000 mL via INTRAVENOUS

## 2017-11-07 MED ORDER — LIDOCAINE HCL (PF) 1 % IJ SOLN
INTRAMUSCULAR | Status: DC | PRN
  Administered 2017-11-07: 15 mL

## 2017-11-07 MED ORDER — ROCURONIUM 10MG/ML (10ML) SYRINGE FOR MEDFUSION PUMP - OPTIME
INTRAVENOUS | Status: DC | PRN
  Administered 2017-11-07: 30 mg via INTRAVENOUS
  Administered 2017-11-07: 70 mg via INTRAVENOUS

## 2017-11-07 MED ORDER — LIDOCAINE HCL (PF) 1 % IJ SOLN
INTRAMUSCULAR | Status: AC
  Filled 2017-11-07: qty 30

## 2017-11-07 MED ORDER — SODIUM CHLORIDE 0.9 % IV SOLN
1500.0000 mg | Freq: Once | INTRAVENOUS | Status: AC
  Administered 2017-11-07: 1500 mg via INTRAVENOUS
  Filled 2017-11-07: qty 1500

## 2017-11-07 MED ORDER — SODIUM BICARBONATE 8.4 % IV SOLN
50.0000 meq | Freq: Once | INTRAVENOUS | Status: AC
  Administered 2017-11-07: 50 meq via INTRAVENOUS
  Filled 2017-11-07: qty 100

## 2017-11-07 SURGICAL SUPPLY — 58 items
BLADE 10 SAFETY STRL DISP (BLADE) ×3 IMPLANT
BLADE CLIPPER SURG (BLADE) IMPLANT
BLADE STERNUM SYSTEM 6 (BLADE) ×3 IMPLANT
BLADE SURG 10 STRL SS (BLADE) ×6 IMPLANT
CLIP TI LARGE 6 (CLIP) ×3 IMPLANT
CLIP VESOCCLUDE MED 24/CT (CLIP) ×3 IMPLANT
CONT SPEC 4OZ CLIKSEAL STRL BL (MISCELLANEOUS) ×6 IMPLANT
COVER BACK TABLE 60X90IN (DRAPES) IMPLANT
COVER MAYO STAND STRL (DRAPES) ×3 IMPLANT
COVER SURGICAL LIGHT HANDLE (MISCELLANEOUS) ×3 IMPLANT
DRAPE SLUSH MACHINE 52X66 (DRAPES) ×3 IMPLANT
DRSG COVADERM 4X10 (GAUZE/BANDAGES/DRESSINGS) ×6 IMPLANT
DRSG TELFA 3X8 NADH (GAUZE/BANDAGES/DRESSINGS) ×3 IMPLANT
DURAPREP 26ML APPLICATOR (WOUND CARE) ×3 IMPLANT
ELECT BLADE 6.5 EXT (BLADE) IMPLANT
ELECT REM PT RETURN 9FT ADLT (ELECTROSURGICAL) ×3
ELECTRODE REM PT RTRN 9FT ADLT (ELECTROSURGICAL) ×1 IMPLANT
GLOVE SURG ORTHO 7.0 STRL STRW (GLOVE) ×6 IMPLANT
GOWN STRL REUS W/ TWL LRG LVL3 (GOWN DISPOSABLE) ×4 IMPLANT
GOWN STRL REUS W/TWL LRG LVL3 (GOWN DISPOSABLE) ×12
KIT POST MORTEM ADULT 36X90 (BAG) ×3 IMPLANT
KIT ROOM TURNOVER OR (KITS) ×3 IMPLANT
LIGACLIP MED TITANIUM (CLIP) ×3 IMPLANT
LOOP VESSEL MAXI BLUE (MISCELLANEOUS) ×3 IMPLANT
LOOP VESSEL MINI RED (MISCELLANEOUS) ×3 IMPLANT
MANIFOLD NEPTUNE II (INSTRUMENTS) ×3 IMPLANT
NEEDLE BIOPSY 14X6 SOFT TISS (NEEDLE) ×3 IMPLANT
NS IRRIG 1000ML POUR BTL (IV SOLUTION) ×6 IMPLANT
PACK AORTA (CUSTOM PROCEDURE TRAY) ×3 IMPLANT
PAD ARMBOARD 7.5X6 YLW CONV (MISCELLANEOUS) ×6 IMPLANT
PENCIL BUTTON HOLSTER BLD 10FT (ELECTRODE) ×3 IMPLANT
SPONGE INTESTINAL PEANUT (DISPOSABLE) ×3 IMPLANT
SPONGE LAP 18X18 X RAY DECT (DISPOSABLE) ×6 IMPLANT
STAPLER VISISTAT 35W (STAPLE) ×3 IMPLANT
SUCTION POOLE TIP (SUCTIONS) ×3 IMPLANT
SUT BONE WAX W31G (SUTURE) ×3 IMPLANT
SUT ETHIBOND 5 LR DA (SUTURE) IMPLANT
SUT ETHILON 1 LR 30 (SUTURE) ×9 IMPLANT
SUT ETHILON 2 LR (SUTURE) ×24 IMPLANT
SUT PDS AB 4-0 SH 27 (SUTURE) ×6 IMPLANT
SUT PROLENE 4 0 RB 1 (SUTURE) ×3
SUT PROLENE 4-0 RB1 .5 CRCL 36 (SUTURE) ×1 IMPLANT
SUT SILK 1 SH (SUTURE) IMPLANT
SUT SILK 1 TIES 10X30 (SUTURE) IMPLANT
SUT SILK 2 0 (SUTURE)
SUT SILK 2 0 SH (SUTURE) ×3 IMPLANT
SUT SILK 2 0 TIES 10X30 (SUTURE) ×3 IMPLANT
SUT SILK 2-0 18XBRD TIE 12 (SUTURE) IMPLANT
SUT SILK 3 0 SH 30 (SUTURE) ×3 IMPLANT
SUT SILK 3 0 SH CR/8 (SUTURE) ×3 IMPLANT
SWAB COLLECTION DEVICE MRSA (MISCELLANEOUS) IMPLANT
SWAB CULTURE ESWAB REG 1ML (MISCELLANEOUS) IMPLANT
TAPE UMBILICAL 1/8 X36 TWILL (MISCELLANEOUS) ×6 IMPLANT
TOWEL OR 17X26 10 PK STRL BLUE (TOWEL DISPOSABLE) ×3 IMPLANT
TUBE CONNECTING 12'X1/4 (SUCTIONS) ×2
TUBE CONNECTING 12X1/4 (SUCTIONS) ×4 IMPLANT
WATER STERILE IRR 1000ML POUR (IV SOLUTION) IMPLANT
YANKAUER SUCT BULB TIP NO VENT (SUCTIONS) ×3 IMPLANT

## 2017-11-07 SURGICAL SUPPLY — 13 items
CATH INFINITI 5 FR 3DRC (CATHETERS) ×2 IMPLANT
CATH INFINITI 5FR MULTPACK ANG (CATHETERS) ×2 IMPLANT
CATH SWAN GANZ 7F STRAIGHT (CATHETERS) ×2 IMPLANT
KIT ENCORE 26 ADVANTAGE (KITS) ×2 IMPLANT
KIT HEART LEFT (KITS) ×2 IMPLANT
PACK CARDIAC CATHETERIZATION (CUSTOM PROCEDURE TRAY) ×2 IMPLANT
SHEATH PINNACLE 5F 10CM (SHEATH) ×2 IMPLANT
SHEATH PINNACLE 7F 10CM (SHEATH) ×2 IMPLANT
SYR MEDRAD MARK V 150ML (SYRINGE) ×2 IMPLANT
TRANSDUCER W/STOPCOCK (MISCELLANEOUS) ×2 IMPLANT
TUBING CIL FLEX 10 FLL-RA (TUBING) ×2 IMPLANT
WIRE EMERALD 3MM-J .035X150CM (WIRE) ×2 IMPLANT
WIRE HI TORQ VERSACORE-J 145CM (WIRE) ×2 IMPLANT

## 2017-11-07 NOTE — Anesthesia Preprocedure Evaluation (Signed)
Anesthesia Evaluation  Patient identified by MRN, date of birth, ID band Patient unresponsive    Reviewed: Unable to perform ROS - Chart review only  Airway Mallampati: Intubated       Dental   Pulmonary    Pulmonary exam normal        Cardiovascular Normal cardiovascular exam     Neuro/Psych TBI. Brain death.    GI/Hepatic   Endo/Other    Renal/GU      Musculoskeletal   Abdominal   Peds  Hematology   Anesthesia Other Findings   Reproductive/Obstetrics                             Anesthesia Physical Anesthesia Plan  ASA: VI  Anesthesia Plan: General   Post-op Pain Management:    Induction: Inhalational  PONV Risk Score and Plan: 2 and Treatment may vary due to age or medical condition  Airway Management Planned: Oral ETT  Additional Equipment:   Intra-op Plan:   Post-operative Plan:   Informed Consent:   Plan Discussed with: CRNA and Surgeon  Anesthesia Plan Comments:         Anesthesia Quick Evaluation

## 2017-11-07 NOTE — Progress Notes (Signed)
Chaplain responded to pager for Cath team activation for patient. Informed by Nurse patient was being assessed for CDC protocol. There was no family present at this time, had spent time with the patient earlier.  Chaplain Janell QuietAudrey Alizey Noren [phone]

## 2017-11-07 NOTE — Progress Notes (Signed)
Recruitment maneuver performed at 0400 AM, ABG to follow per CDS protocol

## 2017-11-07 NOTE — Transfer of Care (Signed)
Organ procurement 

## 2017-11-07 NOTE — Anesthesia Postprocedure Evaluation (Signed)
Anesthesia Post Note  Patient: Jeremiah RosenthalSky Mathews  Procedure(s) Performed: ORGAN PROCUREMENT- KIDNEYS (N/A Chest)     Anesthesia Type: General Comments: Organ procurement procedure. Pt brain dead prior to procedure. Expired in OR.    Last Vitals:  Vitals:   11/22/2017 1900 11/09/2017 1915  BP: 134/68   Pulse: 87 86  Resp: 20 20  Temp: 36.6 C 36.6 C  SpO2: 96% 96%    Last Pain:  Vitals:   11/02/2017 1145  TempSrc: Bladder                 Kennieth RadFitzgerald, Albana Saperstein E

## 2017-11-07 NOTE — Progress Notes (Signed)
No CPT done at this time due to recent return from cath lab, pt flat in bed.  RN notified.

## 2017-11-08 ENCOUNTER — Encounter (HOSPITAL_COMMUNITY): Payer: Self-pay | Admitting: *Deleted

## 2017-11-08 ENCOUNTER — Encounter (HOSPITAL_COMMUNITY): Payer: Self-pay

## 2017-11-08 LAB — POCT I-STAT 3, ART BLOOD GAS (G3+)
Acid-base deficit: 3 mmol/L — ABNORMAL HIGH (ref 0.0–2.0)
Bicarbonate: 22.1 mmol/L (ref 20.0–28.0)
O2 Saturation: 100 %
TCO2: 23 mmol/L (ref 22–32)
pCO2 arterial: 36.7 mmHg (ref 32.0–48.0)
pH, Arterial: 7.388 (ref 7.350–7.450)
pO2, Arterial: 316 mmHg — ABNORMAL HIGH (ref 83.0–108.0)

## 2017-11-08 LAB — BLOOD CULTURE ID PANEL (REFLEXED)
Acinetobacter baumannii: NOT DETECTED
CANDIDA TROPICALIS: NOT DETECTED
Candida albicans: NOT DETECTED
Candida glabrata: NOT DETECTED
Candida krusei: NOT DETECTED
Candida parapsilosis: NOT DETECTED
ENTEROCOCCUS SPECIES: NOT DETECTED
Enterobacter cloacae complex: NOT DETECTED
Enterobacteriaceae species: NOT DETECTED
Escherichia coli: NOT DETECTED
HAEMOPHILUS INFLUENZAE: NOT DETECTED
Klebsiella oxytoca: NOT DETECTED
Klebsiella pneumoniae: NOT DETECTED
LISTERIA MONOCYTOGENES: NOT DETECTED
METHICILLIN RESISTANCE: DETECTED — AB
NEISSERIA MENINGITIDIS: NOT DETECTED
PROTEUS SPECIES: NOT DETECTED
Pseudomonas aeruginosa: NOT DETECTED
STAPHYLOCOCCUS AUREUS BCID: NOT DETECTED
STAPHYLOCOCCUS SPECIES: DETECTED — AB
STREPTOCOCCUS SPECIES: NOT DETECTED
Serratia marcescens: NOT DETECTED
Streptococcus agalactiae: NOT DETECTED
Streptococcus pneumoniae: NOT DETECTED
Streptococcus pyogenes: NOT DETECTED

## 2017-11-08 LAB — POCT I-STAT 3, VENOUS BLOOD GAS (G3P V)
ACID-BASE DEFICIT: 4 mmol/L — AB (ref 0.0–2.0)
BICARBONATE: 21.2 mmol/L (ref 20.0–28.0)
O2 SAT: 88 %
TCO2: 22 mmol/L (ref 22–32)
pCO2, Ven: 36.5 mmHg — ABNORMAL LOW (ref 44.0–60.0)
pH, Ven: 7.372 (ref 7.250–7.430)
pO2, Ven: 56 mmHg — ABNORMAL HIGH (ref 32.0–45.0)

## 2017-11-08 MED FILL — Heparin Sodium (Porcine) 2 Unit/ML in Sodium Chloride 0.9%: INTRAMUSCULAR | Qty: 1500 | Status: AC

## 2017-11-08 MED FILL — Lidocaine HCl Local Preservative Free (PF) Inj 1%: INTRAMUSCULAR | Qty: 30 | Status: AC

## 2017-11-09 LAB — CULTURE, RESPIRATORY

## 2017-11-09 LAB — CULTURE, RESPIRATORY W GRAM STAIN: Culture: NORMAL

## 2017-11-10 LAB — CULTURE, BLOOD (ROUTINE X 2): Special Requests: ADEQUATE

## 2017-11-11 LAB — CULTURE, BLOOD (ROUTINE X 2)
CULTURE: NO GROWTH
Special Requests: ADEQUATE

## 2017-11-28 DEATH — deceased

## 2019-12-15 IMAGING — DX DG CHEST 1V PORT
1 series · 1 of 1 positions shown · non-contrast
Comparison: 11/02/2017

CLINICAL DATA: Follow-up tracheostomy tube placement

EXAM:
PORTABLE CHEST 1 VIEW

[chest ap]
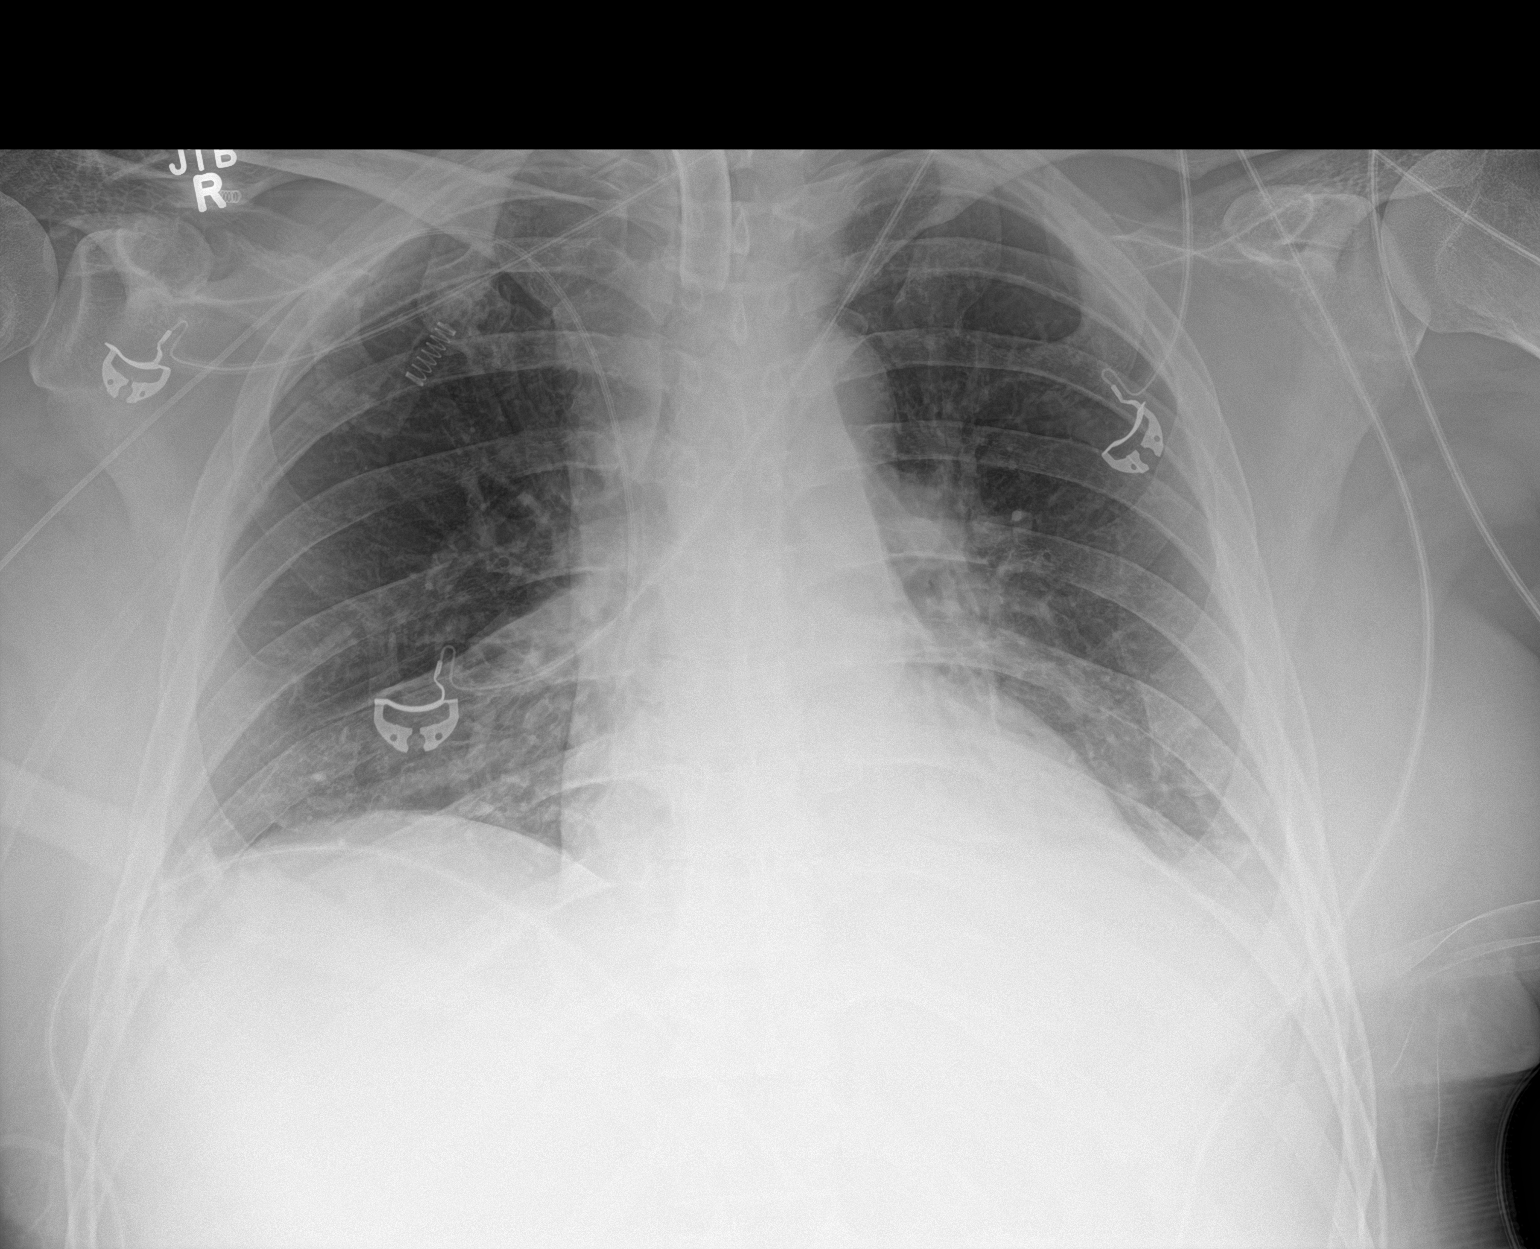

[1 of 1 positions shown; findings below may reference images not displayed]

FINDINGS: Right-sided PICC line and tracheostomy tube are noted in
satisfactory position. Previously seen endotracheal tube and
nasogastric catheter have been removed in the interval. Cardiac
shadow is stable. Mild left basilar atelectasis is noted slightly
increased from the prior study. No bony abnormality is noted.
IMPRESSION: Slight increase in left basilar atelectasis.

Tubes and lines as described.

## 2019-12-16 IMAGING — DX DG CHEST 1V PORT
1 series · 1 of 1 positions shown · non-contrast
Comparison: 11/04/2017

CLINICAL DATA: Hypoxia

EXAM:
PORTABLE CHEST 1 VIEW

[chest ap]
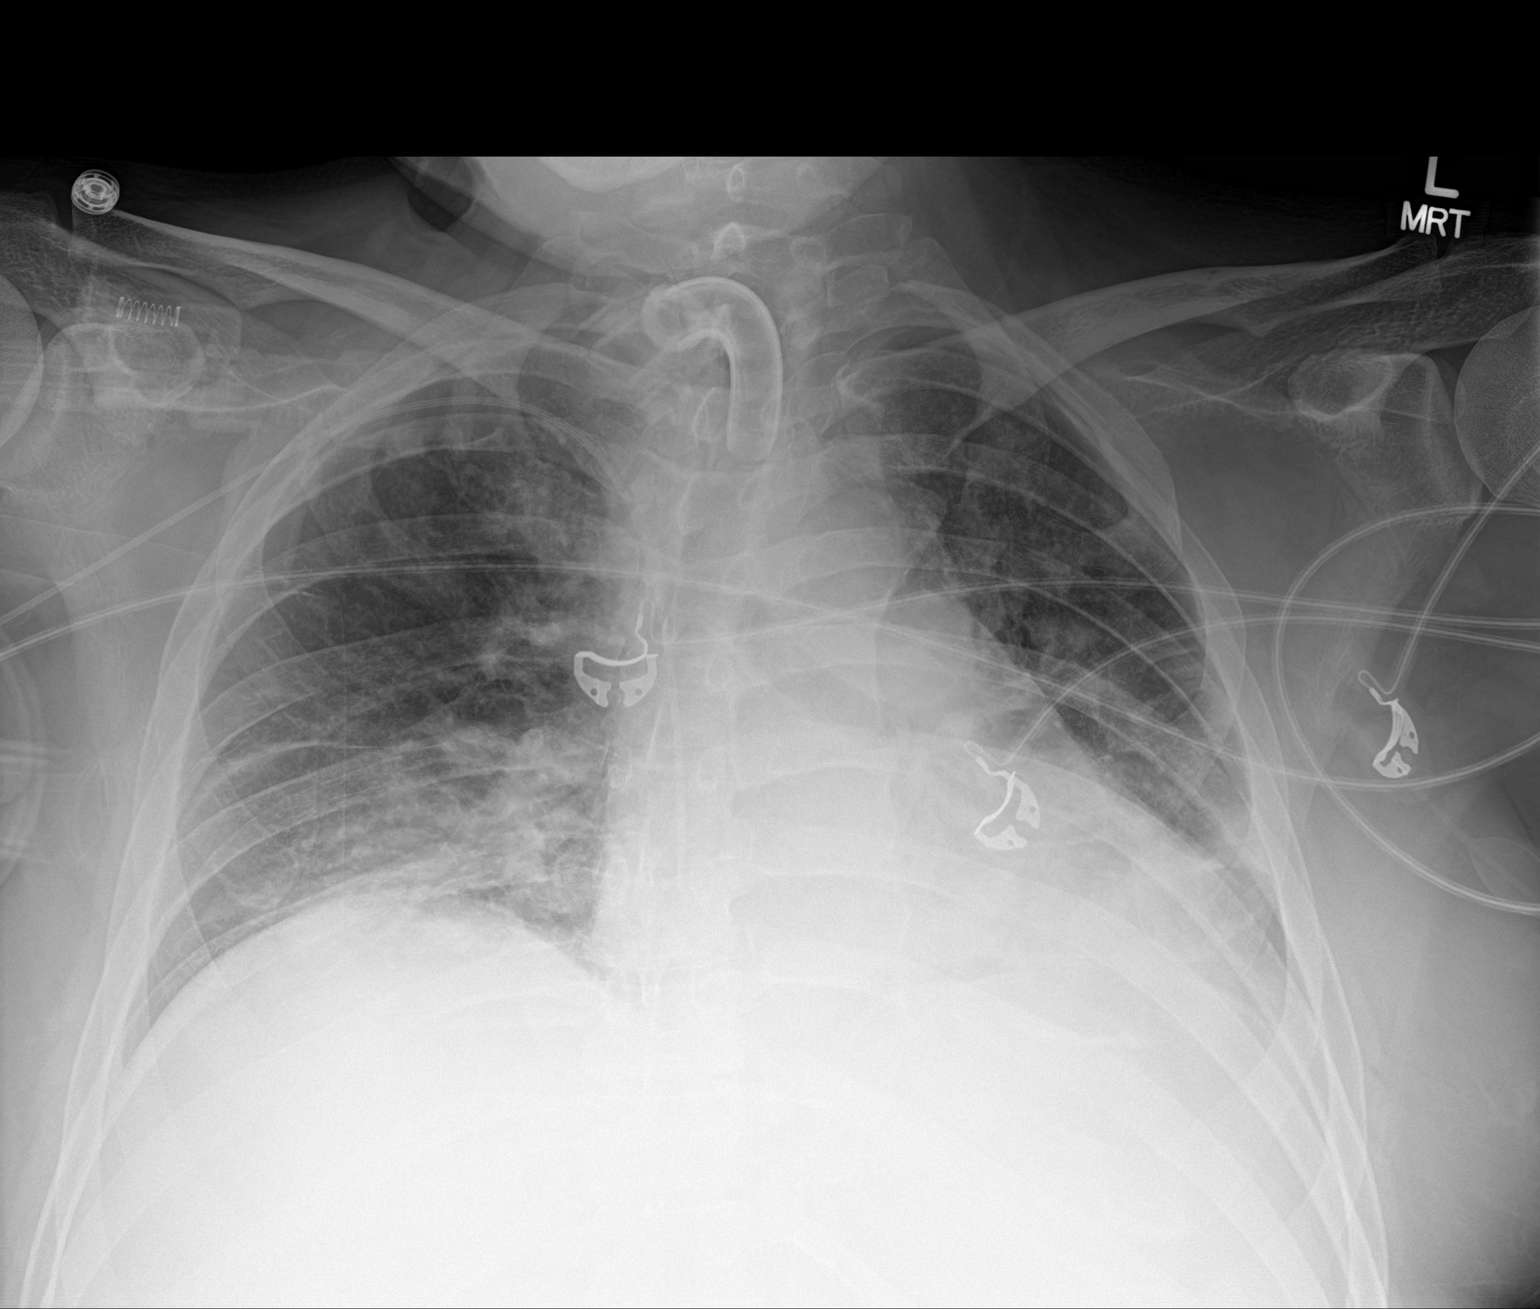

[1 of 1 positions shown; findings below may reference images not displayed]

FINDINGS: Cardiac shadow is stable. Tracheostomy tube and right-sided PICC
line are again seen and stable. The overall inspiratory effort is
poor with bibasilar atelectasis slightly increased from the prior
exam.
IMPRESSION: Poor inspiratory effort with slight increase in bibasilar
atelectasis.
